# Patient Record
Sex: Female | Born: 1965
Health system: Southern US, Community
[De-identification: ages and names within clinical notes are randomized; demographics above are authoritative.]

## PROBLEM LIST (undated history)

## (undated) DIAGNOSIS — J45909 Unspecified asthma, uncomplicated: Secondary | ICD-10-CM

## (undated) DIAGNOSIS — Z803 Family history of malignant neoplasm of breast: Secondary | ICD-10-CM

## (undated) DIAGNOSIS — Z8042 Family history of malignant neoplasm of prostate: Secondary | ICD-10-CM

## (undated) DIAGNOSIS — Z8041 Family history of malignant neoplasm of ovary: Secondary | ICD-10-CM

## (undated) HISTORY — DX: Family history of malignant neoplasm of ovary: Z80.41

## (undated) HISTORY — DX: Unspecified asthma, uncomplicated: J45.909

## (undated) HISTORY — DX: Family history of malignant neoplasm of prostate: Z80.42

## (undated) HISTORY — PX: UPPER GASTROINTESTINAL ENDOSCOPY: SHX188

## (undated) HISTORY — DX: Family history of malignant neoplasm of breast: Z80.3

---

## 1993-03-07 HISTORY — PX: OTHER SURGICAL HISTORY: SHX169

## 2013-03-07 HISTORY — PX: LAPAROSCOPIC GASTRIC SLEEVE RESECTION: SHX5895

## 2017-03-27 DIAGNOSIS — J209 Acute bronchitis, unspecified: Secondary | ICD-10-CM | POA: Diagnosis not present

## 2017-04-01 DIAGNOSIS — J209 Acute bronchitis, unspecified: Secondary | ICD-10-CM | POA: Diagnosis not present

## 2017-07-11 DIAGNOSIS — L219 Seborrheic dermatitis, unspecified: Secondary | ICD-10-CM | POA: Diagnosis not present

## 2017-07-11 DIAGNOSIS — L700 Acne vulgaris: Secondary | ICD-10-CM | POA: Diagnosis not present

## 2017-08-09 DIAGNOSIS — D225 Melanocytic nevi of trunk: Secondary | ICD-10-CM | POA: Diagnosis not present

## 2017-08-09 DIAGNOSIS — B351 Tinea unguium: Secondary | ICD-10-CM | POA: Diagnosis not present

## 2017-08-09 DIAGNOSIS — Z79899 Other long term (current) drug therapy: Secondary | ICD-10-CM | POA: Diagnosis not present

## 2017-08-09 DIAGNOSIS — D2372 Other benign neoplasm of skin of left lower limb, including hip: Secondary | ICD-10-CM | POA: Diagnosis not present

## 2017-08-09 DIAGNOSIS — B353 Tinea pedis: Secondary | ICD-10-CM | POA: Diagnosis not present

## 2017-08-09 DIAGNOSIS — D485 Neoplasm of uncertain behavior of skin: Secondary | ICD-10-CM | POA: Diagnosis not present

## 2017-08-09 DIAGNOSIS — L821 Other seborrheic keratosis: Secondary | ICD-10-CM | POA: Diagnosis not present

## 2017-08-09 DIAGNOSIS — D2371 Other benign neoplasm of skin of right lower limb, including hip: Secondary | ICD-10-CM | POA: Diagnosis not present

## 2017-09-11 DIAGNOSIS — Z79899 Other long term (current) drug therapy: Secondary | ICD-10-CM | POA: Diagnosis not present

## 2017-09-11 DIAGNOSIS — B351 Tinea unguium: Secondary | ICD-10-CM | POA: Diagnosis not present

## 2018-02-09 DIAGNOSIS — M25562 Pain in left knee: Secondary | ICD-10-CM | POA: Diagnosis not present

## 2018-02-15 DIAGNOSIS — S63621A Sprain of interphalangeal joint of right thumb, initial encounter: Secondary | ICD-10-CM | POA: Diagnosis not present

## 2018-02-15 DIAGNOSIS — S83242A Other tear of medial meniscus, current injury, left knee, initial encounter: Secondary | ICD-10-CM | POA: Diagnosis not present

## 2018-03-13 ENCOUNTER — Other Ambulatory Visit: Payer: Self-pay | Admitting: Orthopedic Surgery

## 2018-03-13 DIAGNOSIS — S83242D Other tear of medial meniscus, current injury, left knee, subsequent encounter: Secondary | ICD-10-CM | POA: Diagnosis not present

## 2018-03-13 DIAGNOSIS — M25562 Pain in left knee: Secondary | ICD-10-CM

## 2018-03-19 ENCOUNTER — Ambulatory Visit
Admission: RE | Admit: 2018-03-19 | Discharge: 2018-03-19 | Disposition: A | Discharge status: Home / Self Care | Payer: Self-pay | Source: Ambulatory Visit | Attending: Orthopedic Surgery | Admitting: Orthopedic Surgery

## 2018-03-19 DIAGNOSIS — M25562 Pain in left knee: Secondary | ICD-10-CM | POA: Diagnosis not present

## 2018-04-26 DIAGNOSIS — S83242D Other tear of medial meniscus, current injury, left knee, subsequent encounter: Secondary | ICD-10-CM | POA: Diagnosis not present

## 2018-04-30 DIAGNOSIS — S83242D Other tear of medial meniscus, current injury, left knee, subsequent encounter: Secondary | ICD-10-CM | POA: Diagnosis not present

## 2018-04-30 DIAGNOSIS — M6281 Muscle weakness (generalized): Secondary | ICD-10-CM | POA: Diagnosis not present

## 2018-04-30 DIAGNOSIS — M25562 Pain in left knee: Secondary | ICD-10-CM | POA: Diagnosis not present

## 2018-05-02 DIAGNOSIS — M6281 Muscle weakness (generalized): Secondary | ICD-10-CM | POA: Diagnosis not present

## 2018-05-02 DIAGNOSIS — S83242D Other tear of medial meniscus, current injury, left knee, subsequent encounter: Secondary | ICD-10-CM | POA: Diagnosis not present

## 2018-05-02 DIAGNOSIS — M25562 Pain in left knee: Secondary | ICD-10-CM | POA: Diagnosis not present

## 2018-05-14 DIAGNOSIS — M6281 Muscle weakness (generalized): Secondary | ICD-10-CM | POA: Diagnosis not present

## 2018-05-14 DIAGNOSIS — S83242D Other tear of medial meniscus, current injury, left knee, subsequent encounter: Secondary | ICD-10-CM | POA: Diagnosis not present

## 2018-05-14 DIAGNOSIS — M25562 Pain in left knee: Secondary | ICD-10-CM | POA: Diagnosis not present

## 2018-05-21 ENCOUNTER — Ambulatory Visit (INDEPENDENT_AMBULATORY_CARE_PROVIDER_SITE_OTHER): Payer: 59 | Admitting: Family Medicine

## 2018-05-21 ENCOUNTER — Other Ambulatory Visit: Payer: Self-pay

## 2018-05-21 ENCOUNTER — Other Ambulatory Visit: Payer: Self-pay | Admitting: Family Medicine

## 2018-05-21 VITALS — BP 123/82 | HR 100 | Temp 98.2°F | Ht 66.0 in | Wt 228.0 lb

## 2018-05-21 DIAGNOSIS — R600 Localized edema: Secondary | ICD-10-CM

## 2018-05-21 DIAGNOSIS — G8929 Other chronic pain: Secondary | ICD-10-CM

## 2018-05-21 DIAGNOSIS — R635 Abnormal weight gain: Secondary | ICD-10-CM | POA: Diagnosis not present

## 2018-05-21 DIAGNOSIS — M25562 Pain in left knee: Secondary | ICD-10-CM

## 2018-05-21 DIAGNOSIS — R739 Hyperglycemia, unspecified: Secondary | ICD-10-CM

## 2018-05-21 DIAGNOSIS — N926 Irregular menstruation, unspecified: Secondary | ICD-10-CM

## 2018-05-21 DIAGNOSIS — Z803 Family history of malignant neoplasm of breast: Secondary | ICD-10-CM

## 2018-05-21 DIAGNOSIS — Z9884 Bariatric surgery status: Secondary | ICD-10-CM

## 2018-05-21 DIAGNOSIS — Z1211 Encounter for screening for malignant neoplasm of colon: Secondary | ICD-10-CM

## 2018-05-21 DIAGNOSIS — F5101 Primary insomnia: Secondary | ICD-10-CM

## 2018-05-21 DIAGNOSIS — Z13220 Encounter for screening for lipoid disorders: Secondary | ICD-10-CM

## 2018-05-21 DIAGNOSIS — F439 Reaction to severe stress, unspecified: Secondary | ICD-10-CM | POA: Diagnosis not present

## 2018-05-21 DIAGNOSIS — R5383 Other fatigue: Secondary | ICD-10-CM

## 2018-05-21 LAB — COMPREHENSIVE METABOLIC PANEL
ALT: 18 U/L (ref 0–35)
AST: 15 U/L (ref 0–37)
Albumin: 4 g/dL (ref 3.5–5.2)
Alkaline Phosphatase: 68 U/L (ref 39–117)
BUN: 10 mg/dL (ref 6–23)
CO2: 29 mEq/L (ref 19–32)
Calcium: 9.2 mg/dL (ref 8.4–10.5)
Chloride: 105 mEq/L (ref 96–112)
Creatinine, Ser: 0.94 mg/dL (ref 0.40–1.20)
GFR: 62.83 mL/min (ref >60.00)
Glucose, Bld: 88 mg/dL (ref 70–99)
Potassium: 4.7 mEq/L (ref 3.5–5.1)
Sodium: 141 mEq/L (ref 135–145)
Total Bilirubin: 0.4 mg/dL (ref 0.2–1.2)
Total Protein: 6.7 g/dL (ref 6.0–8.3)

## 2018-05-21 LAB — LIPID PANEL
Cholesterol: 230 mg/dL — ABNORMAL HIGH (ref 0–200)
HDL: 102.9 mg/dL (ref >39.00)
LDL Cholesterol: 109 mg/dL — ABNORMAL HIGH (ref 0–99)
NonHDL: 127.23
Total CHOL/HDL Ratio: 2
Triglycerides: 90 mg/dL (ref 0.0–149.0)
VLDL: 18 mg/dL (ref 0.0–40.0)

## 2018-05-21 LAB — CBC WITH DIFFERENTIAL/PLATELET
Basophils Absolute: 0.1 10*3/uL (ref 0.0–0.1)
Basophils Relative: 1.6 % (ref 0.0–3.0)
Eosinophils Absolute: 0.3 10*3/uL (ref 0.0–0.7)
Eosinophils Relative: 5.5 % — ABNORMAL HIGH (ref 0.0–5.0)
HCT: 42.6 % (ref 36.0–46.0)
Hemoglobin: 14.2 g/dL (ref 12.0–15.0)
Lymphocytes Relative: 29.6 % (ref 12.0–46.0)
Lymphs Abs: 1.7 10*3/uL (ref 0.7–4.0)
MCHC: 33.5 g/dL (ref 30.0–36.0)
MCV: 91.3 fl (ref 78.0–100.0)
Monocytes Absolute: 0.4 10*3/uL (ref 0.1–1.0)
Monocytes Relative: 6.7 % (ref 3.0–12.0)
Neutro Abs: 3.2 10*3/uL (ref 1.4–7.7)
Neutrophils Relative %: 56.6 % (ref 43.0–77.0)
Platelets: 227 10*3/uL (ref 150.0–400.0)
RBC: 4.66 Mil/uL (ref 3.87–5.11)
RDW: 13.7 % (ref 11.5–15.5)
WBC: 5.6 10*3/uL (ref 4.0–10.5)

## 2018-05-21 LAB — TSH: TSH: 1.33 u[IU]/mL (ref 0.35–4.50)

## 2018-05-21 LAB — LUTEINIZING HORMONE: LH: 8.86 m[IU]/mL

## 2018-05-21 LAB — FOLLICLE STIMULATING HORMONE: FSH: 18 m[IU]/mL

## 2018-05-21 LAB — HEMOGLOBIN A1C: Hgb A1c MFr Bld: 5.5 % (ref 4.6–6.5)

## 2018-05-21 MED ORDER — TRAZODONE HCL 50 MG PO TABS: 25.0000 mg | ORAL_TABLET | Freq: Every evening | ORAL | 3 refills | Status: DC | PRN

## 2018-05-21 MED ORDER — PHENTERMINE HCL 37.5 MG PO TABS: 37.5000 mg | ORAL_TABLET | Freq: Every day | ORAL | 2 refills | Status: DC

## 2018-05-21 NOTE — Telephone Encounter (Signed)
Patient calling to check the status of getting these medications sent to the pharmacy today. States they were supposed to be sent today while at her appointment with Dr Juleen China. Please advise.

## 2018-05-21 NOTE — Patient Instructions (Signed)
You have an appointment scheduled for: []   2D Mammogram  [x]   3D Mammogram  []   Bone Density      Your appointment will at the following location  [x]   [facility]      [place], [place]           Make sure to wear two peace clothing  No lotions powders or deodorants the day of the appointment Make sure to bring picture ID and insurance card.  Bring list of medications you are currently taking including any supplements.

## 2018-05-21 NOTE — Progress Notes (Signed)
Nancy Wyatt is a 53 y.o. female is here to [place].   Patient Care Team: Briscoe Deutscher, DO as PCP - General (Family Medicine)   History of Present Illness:   [name], CMA acting as scribe for Dr. Briscoe Deutscher.   HPI:  Genetic Testing: Patient is concerned mother is a breast cancer survivor that was diagnosed premenopausal. She would like to have order placed for genetic testing as well as lab work done regarding her hormone levels. She request cologuard testing as well as mammogram today. Explained that if she has any positive results wit cologuard that colonoscopy will be needed and will most likey not be paid for by insurance. Patient was fine with that and agreed to have Cologuard ordered.   Depression: Patient has hard time with move here from [place]. She has increased weight gain and depression symptoms. She would like to talk about adding an antidepressant that will not increase weight.   Asthma: Patient has self reported history of asthma. She has been on preventative inhaler in the past but is not sure of the name. She has recently been using her rescue inhaler and would like to start back on a preventative today.   Weight gain: Gastric sleeve in 2015. High weight 280. Lowest weight 170. Gained this year, emotional eating. Recently restarted MVM. Just ordered a Peloton. Working remotely with previous Clinical research associate. Left meniscus injury, followed by Dr. Noemi Chapel. Next appointment is April 6. Previous medication for weight loss - [place].  Health Maintenance Due  Topic Date Due  . HIV Screening  08/08/1982  . TETANUS/TDAP  08/08/1986  . PAP SMEAR-Modifier  08/07/1988  . MAMMOGRAM  08/07/2017  . COLONOSCOPY  08/07/2017  . INFLUENZA VACCINE  10/05/2017   No flowsheet data found.  PMHx, SurgHx, SocialHx, Medications, and Allergies were reviewed in the Visit Navigator and updated as appropriate.   History reviewed. No pertinent past medical history.  History  reviewed. No pertinent surgical history.  History reviewed. No pertinent family history.  Social History   Tobacco Use  . Smoking status: Not on file  Substance Use Topics  . Alcohol use: Not on file  . Drug use: Not on file    Current Medications and Allergies   Current Outpatient Medications:  .  albuterol (PROAIR HFA) 108 (90 Base) MCG/ACT inhaler, ProAir HFA 90 mcg/actuation aerosol inhaler, Disp: 2 Inhaler, Rfl: 3 .  hydrochlorothiazide (HYDRODIURIL) 12.5 MG tablet, hydrochlorothiazide 12.5 mg tablet, Disp: , Rfl:  .  ketoconazole (NIZORAL) 2 % cream, ketoconazole 2 % topical cream, Disp: 15 g, Rfl: 1 .  ketoconazole (NIZORAL) 2 % shampoo, ketoconazole 2 % shampoo, Disp: 120 mL, Rfl: 1 .  phentermine (ADIPEX-P) 37.5 MG tablet, Take 1 tablet (37.5 mg total) by mouth daily before breakfast., Disp: 30 tablet, Rfl: 2 .  terbinafine (LAMISIL) 250 MG tablet, Take 1 tablet (250 mg total) by mouth daily., Disp: 30 tablet, Rfl: 1 .  traZODone (DESYREL) 50 MG tablet, Take 0.5-1 tablets (25-50 mg total) by mouth at bedtime as needed for sleep., Disp: 30 tablet, Rfl: 3   Allergies  Allergen Reactions  . Sulfamethoxazole Rash    hives   Review of Systems   Pertinent items are noted in the HPI. Otherwise, a complete ROS is negative.  Vitals   Vitals:   05/21/18 1013  BP: 123/82  Pulse: 100  Temp: 98.2 F (36.8 C)  TempSrc: Oral  SpO2: 98%  Weight: 228 lb (103.4 kg)  Height: 5\' 6"  (  1.676 m)     Body mass index is 36.8 kg/m.  Physical Exam   Physical Exam Vitals signs and nursing note reviewed.  HENT:     Head: Normocephalic and atraumatic.  Eyes:     Pupils: Pupils are equal, round, and reactive to light.  Neck:     Musculoskeletal: Normal range of motion and neck supple.  Cardiovascular:     Rate and Rhythm: Normal rate and regular rhythm.     Heart sounds: Normal heart sounds.  Pulmonary:     Effort: Pulmonary effort is normal.  Abdominal:     Palpations:  Abdomen is soft.  Skin:    General: Skin is warm.  Psychiatric:        Behavior: Behavior normal.     Results for orders placed or performed in visit on 05/21/18  CBC with Differential/Platelet  Result Value Ref Range   WBC 5.6 4.0 - 10.5 K/uL   RBC 4.66 3.87 - 5.11 Mil/uL   Hemoglobin 14.2 12.0 - 15.0 g/dL   HCT 42.6 36.0 - 46.0 %   MCV 91.3 78.0 - 100.0 fl   MCHC 33.5 30.0 - 36.0 g/dL   RDW 13.7 11.5 - 15.5 %   Platelets 227.0 150.0 - 400.0 K/uL   Neutrophils Relative % 56.6 43.0 - 77.0 %   Lymphocytes Relative 29.6 12.0 - 46.0 %   Monocytes Relative 6.7 3.0 - 12.0 %   Eosinophils Relative 5.5 (H) 0.0 - 5.0 %   Basophils Relative 1.6 0.0 - 3.0 %   Neutro Abs 3.2 1.4 - 7.7 K/uL   Lymphs Abs 1.7 0.7 - 4.0 K/uL   Monocytes Absolute 0.4 0.1 - 1.0 K/uL   Eosinophils Absolute 0.3 0.0 - 0.7 K/uL   Basophils Absolute 0.1 0.0 - 0.1 K/uL  Comprehensive metabolic panel  Result Value Ref Range   Sodium 141 135 - 145 mEq/L   Potassium 4.7 3.5 - 5.1 mEq/L   Chloride 105 96 - 112 mEq/L   CO2 29 19 - 32 mEq/L   Glucose, Bld 88 70 - 99 mg/dL   BUN 10 6 - 23 mg/dL   Creatinine, Ser 0.94 0.40 - 1.20 mg/dL   Total Bilirubin 0.4 0.2 - 1.2 mg/dL   Alkaline Phosphatase 68 39 - 117 U/L   AST 15 0 - 37 U/L   ALT 18 0 - 35 U/L   Total Protein 6.7 6.0 - 8.3 g/dL   Albumin 4.0 3.5 - 5.2 g/dL   Calcium 9.2 8.4 - 10.5 mg/dL   GFR 62.83 >60.00 mL/min  Hemoglobin A1c  Result Value Ref Range   Hgb A1c MFr Bld 5.5 4.6 - 6.5 %  Lipid panel  Result Value Ref Range   Cholesterol 230 (H) 0 - 200 mg/dL   Triglycerides 90.0 0.0 - 149.0 mg/dL   HDL 102.90 >39.00 mg/dL   VLDL 18.0 0.0 - 40.0 mg/dL   LDL Cholesterol 109 (H) 0 - 99 mg/dL   Total CHOL/HDL Ratio 2    NonHDL 127.23   TSH  Result Value Ref Range   TSH 1.33 0.35 - 4.50 uIU/mL  FSH  Result Value Ref Range   FSH 18.0 mIU/ML  LH  Result Value Ref Range   LH 8.86 mIU/mL    Assessment and Plan   Nancy Wyatt was seen today for establish  care.  Diagnoses and all orders for this visit:  Fatigue, unspecified type  History of bariatric surgery, gastric sleeve, 2015  Weight gain -  CBC with Differential/Platelet -     Comprehensive metabolic panel -     Hemoglobin A1c -     Lipid panel -     TSH -     phentermine (ADIPEX-P) 37.5 MG tablet; Take 1 tablet (37.5 mg total) by mouth daily before breakfast.  Situational stress  Family history of breast cancer in first degree relative -     Ambulatory referral to [facility]; Future  Menstrual irregularity -     FSH -     LH  Chronic pain of left knee, menisus injury, followed by [facility]  Lower extremity edema  Screening for malignant neoplasm of colon -     Cologuard  Primary insomnia -     traZODone (DESYREL) 50 MG tablet; Take 0.5-1 tablets (25-50 mg total) by mouth at bedtime as needed for sleep.  Screening for lipid disorders -     Lipid panel  Hyperglycemia -     Hemoglobin A1c    . Orders and follow up as documented in [place], reviewed diet, exercise and weight control, cardiovascular risk and specific lipid/LDL goals reviewed, reviewed medications and side effects in detail.  . Reviewed expectations re: course of current medical issues. . Outlined signs and symptoms indicating need for more acute intervention. . Patient verbalized understanding and all questions were answered. . Patient received an After Visit Summary.  CMA served as Education administrator during this visit. History, Physical, and Plan performed by medical provider. The above documentation has been reviewed and is accurate and complete. Briscoe Deutscher, D.O.  Briscoe Deutscher, DO [place], [place] 05/24/2018  Records requested if needed. Time spent with the patient: 45 minutes, of which >50% was spent in obtaining information about her symptoms, reviewing her previous labs, evaluations, and treatments, counseling her about her condition (please see the  discussed topics above), and developing a plan to further investigate it; she had a number of questions which I addressed.

## 2018-05-21 NOTE — Telephone Encounter (Signed)
Copied from [place] [phone]. Topic: Quick Communication - Rx Refill/Question >> May 21, 2018 12:29 PM [place], [place] D wrote: Medication: albuterol (PROAIR HFA) 108 (90 Base) MCG/ACT inhaler / ketoconazole (NIZORAL) 2 % cream / ketoconazole (NIZORAL) 2 % shampoo / terbinafine (LAMISIL) 250 MG tablet / Pt stated these rx's were not sent to pharmacy during her OV this morning. Please advise.  Has the patient contacted their pharmacy? Yes.   (Agent: If no, request that the patient contact the pharmacy for the refill.) (Agent: If yes, when and what did the pharmacy advise?)  Preferred Pharmacy (with phone number or street name): [id number] - [place], [place]. AT [address] [phone] (Phone) [phone] (Fax)  Agent: Please be advised that RX refills may take up to 3 business days. We ask that you follow-up with your pharmacy.

## 2018-05-22 ENCOUNTER — Encounter: Payer: Self-pay | Admitting: Family Medicine

## 2018-05-22 DIAGNOSIS — G8929 Other chronic pain: Secondary | ICD-10-CM | POA: Insufficient documentation

## 2018-05-22 DIAGNOSIS — F439 Reaction to severe stress, unspecified: Secondary | ICD-10-CM | POA: Insufficient documentation

## 2018-05-22 DIAGNOSIS — M25562 Pain in left knee: Secondary | ICD-10-CM

## 2018-05-22 DIAGNOSIS — R635 Abnormal weight gain: Secondary | ICD-10-CM | POA: Insufficient documentation

## 2018-05-22 DIAGNOSIS — Z9884 Bariatric surgery status: Secondary | ICD-10-CM | POA: Insufficient documentation

## 2018-05-22 DIAGNOSIS — Z803 Family history of malignant neoplasm of breast: Secondary | ICD-10-CM | POA: Insufficient documentation

## 2018-05-22 DIAGNOSIS — N926 Irregular menstruation, unspecified: Secondary | ICD-10-CM | POA: Insufficient documentation

## 2018-05-22 MED ORDER — KETOCONAZOLE 2 % EX SHAM: MEDICATED_SHAMPOO | CUTANEOUS | 1 refills | Status: DC

## 2018-05-22 MED ORDER — KETOCONAZOLE 2 % EX CREA: TOPICAL_CREAM | CUTANEOUS | 1 refills | Status: DC

## 2018-05-22 MED ORDER — ALBUTEROL SULFATE HFA 108 (90 BASE) MCG/ACT IN AERS: INHALATION_SPRAY | RESPIRATORY_TRACT | 3 refills | Status: DC

## 2018-05-22 MED ORDER — TERBINAFINE HCL 250 MG PO TABS: 250.0000 mg | ORAL_TABLET | Freq: Every day | ORAL | 1 refills | Status: DC

## 2018-05-22 NOTE — Telephone Encounter (Signed)
See note

## 2018-05-22 NOTE — Telephone Encounter (Signed)
Requested medication (s) are due for refill today: ? Albuterol, Requested medication (s) are on the active medication list: yes  Last refill:  ?  Future visit scheduled: no  Notes to clinic: historical provider  Requested medication (s) are due for refill today:  ketoconazole cream ?   Requested medication (s) are on the active medication list: yes  Last refill:  ?  Future visit scheduled: no  Notes to clinic: historical provider  Requested medication (s) are due for refill today:  ketoconazole shampoo ?   Requested medication (s) are on the active medication list: yes  Last refill:  ?  Future visit scheduled: no  Notes to clinic: historical provider  Requested medication (s) are due for refill today: terbinafine ?  Requested medication (s) are on the active medication list: yes  Last refill:  ?  Future visit scheduled: no  Notes to clinic: historical provider  Requested Prescriptions  Pending Prescriptions Disp Refills   albuterol (PROAIR HFA) 108 (90 Base) MCG/ACT inhaler       Pulmonology:  Beta Agonists Failed - 05/22/2018  7:45 AM      Failed - One inhaler should last at least one month. If the patient is requesting refills earlier, contact the patient to check for uncontrolled symptoms.      Passed - Valid encounter within last 12 months    Recent Outpatient Visits          Yesterday Fatigue, unspecified type   [place] Wallace, [place], DO      Future Appointments            In 3 months Juleen China, [place], DO [facility]          ketoconazole (NIZORAL) 2 % cream 15 g      Over the Counter:  OTC Passed - 05/22/2018  7:45 AM      Passed - Valid encounter within last 12 months    Recent Outpatient Visits          Yesterday Fatigue, unspecified type   [place] Wallace, [place], DO      Future Appointments            In 3 months Briscoe Deutscher, DO [place]  [place], [facility]          ketoconazole (NIZORAL) 2 % shampoo 120 mL      Over the Counter:  OTC Passed - 05/22/2018  7:45 AM      Passed - Valid encounter within last 12 months    Recent Outpatient Visits          Yesterday Fatigue, unspecified type   [place] Wallace, [place], DO      Future Appointments            In 3 months Briscoe Deutscher, [place], [facility]          terbinafine (LAMISIL) 250 MG tablet       Off-Protocol Failed - 05/22/2018  7:45 AM      Failed - Medication not assigned to a protocol, review manually.      Passed - Valid encounter within last 12 months    Recent Outpatient Visits          Yesterday Fatigue, unspecified type   [place] Wallace, [place], DO      Future Appointments            In 3 months Juleen China,  Danae Chen, [place], [place]

## 2018-05-24 ENCOUNTER — Encounter: Payer: Self-pay | Admitting: Family Medicine

## 2018-05-28 ENCOUNTER — Other Ambulatory Visit: Payer: Self-pay

## 2018-05-28 MED ORDER — KETOCONAZOLE 2 % EX SHAM: MEDICATED_SHAMPOO | CUTANEOUS | 1 refills | Status: DC

## 2018-05-31 ENCOUNTER — Telehealth: Payer: Self-pay | Admitting: Genetic Counselor

## 2018-05-31 ENCOUNTER — Encounter: Payer: Self-pay | Admitting: Genetic Counselor

## 2018-05-31 NOTE — Telephone Encounter (Signed)
A genetic counseling appt has been scheduled for the pt to see Roma Kayser on 6/10 at 10am. Letter mailed.

## 2018-06-07 ENCOUNTER — Other Ambulatory Visit: Payer: Self-pay

## 2018-06-07 MED ORDER — ALBUTEROL SULFATE HFA 108 (90 BASE) MCG/ACT IN AERS: INHALATION_SPRAY | RESPIRATORY_TRACT | 3 refills | Status: AC

## 2018-06-30 ENCOUNTER — Encounter: Payer: Self-pay | Admitting: Family Medicine

## 2018-07-23 DIAGNOSIS — M94262 Chondromalacia, left knee: Secondary | ICD-10-CM | POA: Diagnosis not present

## 2018-08-01 ENCOUNTER — Other Ambulatory Visit: Payer: Self-pay | Admitting: Family Medicine

## 2018-08-01 NOTE — Telephone Encounter (Signed)
Last OV 05/21/18 Last refill 05/22/18 #30/1 Next OV 08/20/18

## 2018-08-08 ENCOUNTER — Other Ambulatory Visit: Payer: Self-pay | Admitting: Family Medicine

## 2018-08-08 DIAGNOSIS — F5101 Primary insomnia: Secondary | ICD-10-CM

## 2018-08-13 ENCOUNTER — Telehealth: Payer: Self-pay | Admitting: Genetic Counselor

## 2018-08-13 NOTE — Telephone Encounter (Signed)
Called patient regarding upcoming Webex appointment, per patient's request lab appointment has been cancelled and patient will be doing a virtual visit.

## 2018-08-15 ENCOUNTER — Encounter: Payer: Self-pay | Admitting: Genetic Counselor

## 2018-08-15 ENCOUNTER — Other Ambulatory Visit: Payer: 59

## 2018-08-15 ENCOUNTER — Inpatient Hospital Stay: Payer: 59 | Attending: Genetic Counselor | Admitting: Genetic Counselor

## 2018-08-15 DIAGNOSIS — Z8041 Family history of malignant neoplasm of ovary: Secondary | ICD-10-CM | POA: Insufficient documentation

## 2018-08-15 DIAGNOSIS — Z8042 Family history of malignant neoplasm of prostate: Secondary | ICD-10-CM

## 2018-08-15 DIAGNOSIS — Z803 Family history of malignant neoplasm of breast: Secondary | ICD-10-CM

## 2018-08-15 DIAGNOSIS — Z1379 Encounter for other screening for genetic and chromosomal anomalies: Secondary | ICD-10-CM | POA: Diagnosis not present

## 2018-08-15 NOTE — Progress Notes (Signed)
REFERRING PROVIDER: Briscoe Deutscher, [place], [place] [address]  PRIMARY PROVIDER:  Briscoe Deutscher, DO  PRIMARY REASON FOR VISIT:  1. Family history of breast cancer   2. Family history of ovarian cancer   3. Family history of prostate cancer      HISTORY OF PRESENT ILLNESS:   I connected with Nancy Wyatt on 08/15/2018 at 10 AM EDT by Webex video conference and verified that I am speaking with the correct person using two identifiers.   Patient location: Home Provider location: Office  Nancy Wyatt, a 53 y.o. female, was seen for a [facility] cancer genetics consultation at the request of Dr. Juleen China due to a family history of breast cancer.  Nancy Wyatt presents to clinic today to discuss the possibility of a hereditary predisposition to cancer, genetic testing, and to further clarify her future cancer risks, as well as potential cancer risks for family members.   Nancy Wyatt is a 53 y.o. female with no personal history of cancer.    CANCER HISTORY:   No history exists.     RISK FACTORS:  Menarche was at age 53.  First live birth at age N/A.  OCP use for approximately 0 years.  Ovaries intact: yes.  Hysterectomy: no.  Menopausal status: premenopausal.  HRT use: 0 years. Colonoscopy: yes; normal. Mammogram within the last year: no. Number of breast biopsies: 0. Up to date with pelvic exams: yes. Any excessive radiation exposure in the past: no  Past Medical History:  Diagnosis Date  . Family history of breast cancer   . Family history of ovarian cancer   . Family history of prostate cancer     No past surgical history on file.  Social History   Socioeconomic History  . Marital status: Single    Spouse name: Not on file  . Number of children: Not on file  . Years of education: Not on file  . Highest education level: Not on file  Occupational History  . Not on file  Social Needs  . Financial resource strain: Not on file  . Food  insecurity:    Worry: Not on file    Inability: Not on file  . Transportation needs:    Medical: Not on file    Non-medical: Not on file  Tobacco Use  . Smoking status: Never Smoker  . Smokeless tobacco: Never Used  Substance and Sexual Activity  . Alcohol use: Yes  . Drug use: Never  . Sexual activity: Yes    Partners: Male  Lifestyle  . Physical activity:    Days per week: Not on file    Minutes per session: Not on file  . Stress: Not on file  Relationships  . Social connections:    Talks on phone: Not on file    Gets together: Not on file    Attends religious service: Not on file    Active member of club or organization: Not on file    Attends meetings of clubs or organizations: Not on file    Relationship status: Not on file  Other Topics Concern  . Not on file  Social History Narrative  . Not on file     FAMILY HISTORY:  We obtained a detailed, 4-generation family history.  Significant diagnoses are listed below: Family History  Problem Relation Age of Onset  . Breast cancer Mother 24  . Asthma Father   . Heart attack Father   . Heart disease Father   .  Prostate cancer Maternal Grandfather   . Mental illness Brother   . Ovarian cancer Maternal Aunt 70  . Head & neck cancer Maternal Uncle   . Skin cancer Maternal Aunt   . Breast cancer Cousin 21  . Skin cancer Cousin   . Skin cancer Cousin   . Breast cancer Paternal Aunt 64  . Breast cancer Paternal Aunt 6    The patient does not have children.  She has three brothers who are cancer free.  Her father is deceased and her mother is living.  The patient's father died of a heart attack.  He had tow brothers and three sisters.  Two sisters had breast cancer.  Both of his parents are deceased.  The patient's father died of a heart attack and his mother died of a non-malignant brain tumor.  His mother had two nieces who had brain tumors as well.  The patient's mother had breast cancer at 74.  She had three  brothers and seven sisters.  One brother had a head/neck cancer, one sister had ovarian cancer, and one sister had skin cancer.  This last sister has a daughter who had breast cancer at 18.  The patient's maternal grandparents are deceased.  The grandfather had prostate cancer.  Nancy Wyatt is unaware of previous family history of genetic testing for hereditary cancer risks. Patient's maternal ancestors are of [place] descent, and paternal ancestors are of [place] descent. There is no reported Ashkenazi Jewish ancestry. There is no known consanguinity.    GENETIC COUNSELING ASSESSMENT: Nancy Wyatt is a 53 y.o. female with a family history of cancer which is somewhat suggestive of a hereditary cancer syndrome and predisposition to cancer. We, therefore, discussed and recommended the following at today's visit.   DISCUSSION: We discussed that 5 - 10% of breast cancer is hereditary, with most cases associated with BRCA mutations.  There are other genes that can be associated with hereditary breast cancer syndromes.  We discussed that the patient has bilineal risk, with breast cancer on both sides of the family.  Her father's family also has a family history of brain cancer.  We discussed that this bilineal risk can increase her risk for breast cancer, even if there is not a hereditary mutation in the family.  We discussed that testing is beneficial for several reasons including knowing how to follow individuals after completing their treatment, identifying whether potential treatment options such as PARP inhibitors would be beneficial, and understand if other family members could be at risk for cancer and allow them to undergo genetic testing.   We reviewed the characteristics, features and inheritance patterns of hereditary cancer syndromes. We also discussed genetic testing, including the appropriate family members to test, the process of testing, insurance coverage and turn-around-time for results. We  discussed the implications of a negative, positive and/or variant of uncertain significant result. We recommended Nancy Wyatt pursue genetic testing for the multi-cancer gene panel. The Multi-Gene Panel offered by [facility] includes sequencing and/or deletion duplication testing of the following 85 genes: AIP, ALK, APC, ATM, AXIN2,BAP1,  BARD1, BLM, BMPR1A, BRCA1, BRCA2, BRIP1, CASR, CDC73, CDH1, CDK4, CDKN1B, CDKN1C, CDKN2A (p14ARF), CDKN2A (p16INK4a), CEBPA, CHEK2, CTNNA1, DICER1, DIS3L2, EGFR (c.2369C>T, p.Thr790Met variant only), EPCAM (Deletion/duplication testing only), FH, FLCN, GATA2, GPC3, GREM1 (Promoter region deletion/duplication testing only), HOXB13 (c.251G>A, p.Gly84Glu), HRAS, KIT, MAX, MEN1, MET, MITF (c.952G>A, p.Glu318Lys variant only), MLH1, MSH2, MSH3, MSH6, MUTYH, NBN, NF1, NF2, NTHL1, PALB2, PDGFRA, PHOX2B, PMS2, POLD1, POLE, POT1, PRKAR1A, PTCH1, PTEN, RAD50,  RAD51C, RAD51D, RB1, RECQL4, RET, RNF43, RUNX1, SDHAF2, SDHA (sequence changes only), SDHB, SDHC, SDHD, SMAD4, SMARCA4, SMARCB1, SMARCE1, STK11, SUFU, TERC, TERT, TMEM127, TP53, TSC1, TSC2, VHL, WRN and WT1.    Based on Nancy Wyatt's family history of cancer, she meets medical criteria for genetic testing. Despite that she meets criteria, she may still have an out of pocket cost. We discussed that if her out of pocket cost for testing is over $100, the laboratory will call and confirm whether she wants to proceed with testing.  If the out of pocket cost of testing is less than $100 she will be billed by the genetic testing laboratory.   Based on the patient's family history, a statistical model (Tyrer Cusik) was used to estimate her risk of developing breast cancer. This estimates her lifetime risk of developing breast cancer to be approximately 41.3%. This estimation does not consider any genetic testing results.  The patient's lifetime breast cancer risk is a preliminary estimate based on available information using one of several  models endorsed by the [place] (ACS). The ACS recommends consideration of breast MRI screening as an adjunct to mammography for patients at high risk (defined as 20% or greater lifetime risk).   Nancy Wyatt has been determined to be at high risk for breast cancer.  Therefore, we recommend that annual screening with mammography and breast MRI be performed.  We discussed that Nancy Wyatt should discuss her individual situation with her referring physician and determine a breast cancer screening plan with which they are both comfortable.      PLAN: After considering the risks, benefits, and limitations, Nancy Wyatt provided informed consent to pursue genetic testing and the blood sample was sent to [facility] for analysis of the multi-cancer panel. Results should be available within approximately 2-3 weeks' time, at which point they will be disclosed by telephone to Nancy Wyatt, as will any additional recommendations warranted by these results. Nancy Wyatt will receive a summary of her genetic counseling visit and a copy of her results once available. This information will also be available in Epic.   Lastly, we encouraged Nancy Wyatt to remain in contact with cancer genetics annually so that we can continuously update the family history and inform her of any changes in cancer genetics and testing that may be of benefit for this family.   Nancy Wyatt questions were answered to her satisfaction today. Our contact information was provided should additional questions or concerns arise. Thank you for the referral and allowing [place] to share in the care of your patient.    P. Florene Glen, [place], [facility] Certified Genetic Counselor [place] [email or web address] phone: [phone]  The patient was seen for a total of 65 minutes in face-to-face genetic counseling.  This patient was discussed with Drs. Magrinat, Lindi Adie and/or Burr Medico who agrees with the above.     _______________________________________________________________________ For Office Staff:  Number of people involved in session: 1 Was an Intern/ student involved with case: no

## 2018-08-17 ENCOUNTER — Other Ambulatory Visit: Payer: Self-pay

## 2018-08-19 NOTE — Progress Notes (Signed)
Virtual Visit via Video   Due to the COVID-19 pandemic, this visit was completed with telemedicine (audio/video) technology to reduce patient and provider exposure as well as to preserve personal protective equipment.   I connected with Nancy Wyatt by a video enabled telemedicine application and verified that I am speaking with the correct person using two identifiers. Location patient: Home Location provider: [facility], Office Persons participating in the virtual visit: Nancy Wyatt, Nancy Wyatt, Nancy Wyatt, CMA acting as scribe for Dr. Briscoe Wyatt.   I discussed the limitations of evaluation and management by telemedicine and the availability of in person appointments. The patient expressed understanding and agreed to proceed.  Care Team   Patient Care Team: Nancy Deutscher, Nancy as PCP - General (Family Medicine) Elsie Saas, MD as Consulting Physician (Orthopedic Surgery)  Subjective:   HPI: Patient had been started on Phentermine as well as Trazodone. She only took for about two weeks. She has had a lot of issues with Covid deaths (her 99, a very good friend's son that was only 75 and very good friends mother and father)  that were close to her as well as issues with not eating well or exsecting sue to issues. She would like to restart.   She has had increased back pain and thinks that it may be due to the increase in size of breast. Hopes that this may improve with weight loss, but even with previous weight loss had discomfort from pendulous breasts.   FamHx update, grandparents with colon issues. Now, okay to get colonoscopy.   Genetic counselor visit reviewed. Needs breast MRI.  In [place] with her mom until September. Working remotely due to [employer].   Interested in therapy. T[employer]oc is through her benefits. Will offer other options in [place].  Tumeric recommended by Orthopedist.   Seborrheic dermatitis/psoriasis flare on face. Has requested Protopic  through Dermatology.   Due for CPE, PAP.   Review of Systems  Constitutional: Negative for chills and fever.  HENT: Negative for hearing loss and tinnitus.   Eyes: Negative for blurred vision and double vision.  Respiratory: Negative for cough.   Cardiovascular: Negative for chest pain, palpitations and leg swelling.  Gastrointestinal: Negative for heartburn, nausea and vomiting.  Genitourinary: Negative for dysuria, frequency and urgency.  Musculoskeletal: Negative for myalgias and neck pain.  Neurological: Negative for dizziness and headaches.  Psychiatric/Behavioral: Negative for depression and suicidal ideas.     Patient Active Problem List   Diagnosis Date Noted  . Pendulous breast 08/20/2018  . Family history of breast cancer   . Family history of ovarian cancer   . Family history of prostate cancer   . Chronic pain of left knee, menisus injury, followed by [facility] 05/22/2018  . Menstrual irregularity 05/22/2018  . Family history of breast cancer in first degree relative 05/22/2018  . Situational stress 05/22/2018  . Weight gain 05/22/2018  . History of bariatric surgery, gastric sleeve, 2015 05/22/2018    Social History   Tobacco Use  . Smoking status: Never Smoker  . Smokeless tobacco: Never Used  Substance Use Topics  . Alcohol use: Yes    Current Outpatient Medications:  .  albuterol (PROAIR HFA) 108 (90 Base) MCG/ACT inhaler, 2 puff ever 8 hours as needed., Disp: 2 Inhaler, Rfl: 3 .  hydrochlorothiazide (HYDRODIURIL) 12.5 MG tablet, hydrochlorothiazide 12.5 mg tablet, Disp: , Rfl:  .  ketoconazole (NIZORAL) 2 % cream, ketoconazole 2 % topical cream, Disp: 15 g, Rfl: 1 .  ketoconazole (NIZORAL) 2 % shampoo, USE AS DIRECTED, Disp: 120 mL, Rfl: 1 .  phentermine (ADIPEX-P) 37.5 MG tablet, Take 1 tablet (37.5 mg total) by mouth daily before breakfast., Disp: 30 tablet, Rfl: 2 .  traZODone (DESYREL) 50 MG tablet, TAKE 1/2 TO 1 TABLET BY MOUTH EVERY DAY AT  BEDTIME AS NEEDED FOR SLEEP., Disp: 90 tablet, Rfl: 2  Allergies  Allergen Reactions  . Sulfamethoxazole Rash    hives    Objective:   VITALS: Per patient if applicable, see vitals. GENERAL: Alert, appears well and in no acute distress. HEENT: Atraumatic, conjunctiva clear, no obvious abnormalities on inspection of external nose and ears. NECK: Normal movements of the head and neck. CARDIOPULMONARY: No increased WOB. Speaking in clear sentences. I:E ratio WNL.  MS: Moves all visible extremities without noticeable abnormality. PSYCH: Pleasant and cooperative, well-groomed. Speech normal rate and rhythm. Affect is appropriate. Insight and judgement are appropriate. Attention is focused, linear, and appropriate.  NEURO: CN grossly intact. Oriented as arrived to appointment on time with no prompting. Moves both UE equally.  SKIN: No obvious lesions, wounds, erythema, or cyanosis noted on face or hands.  Depression screen PHQ 2/9 06/30/2018  Decreased Interest 1  Down, Depressed, Hopeless 2  PHQ - 2 Score 3  Altered sleeping 2  Tired, decreased energy 2  Change in appetite 3  Feeling bad or failure about yourself  3  Trouble concentrating 2  Moving slowly or fidgety/restless 1  Suicidal thoughts 0  PHQ-9 Score 16  Difficult doing work/chores Somewhat difficult   Assessment and Plan:   Athenia was seen today for follow-up.  Diagnoses and all orders for this visit:  Situational stress  Encounter for screening colonoscopy -     Ambulatory referral to Gastroenterology  History of bariatric surgery, gastric sleeve, 2015  Chronic pain of left knee, menisus injury, followed by [facility]  Pendulous breast  Weight gain Patient will start the Phentermine 37.5 daily. She will decrease CHO and increase exercise as tolerated.  Primary insomnia Patient will restart Trazodone at 25 mg q hs.  Morbid obesity ([place])  Seborrheic dermatitis   . COVID-19 Education: The signs and  symptoms of COVID-19 were discussed with the patient and how to seek care for testing if needed. The importance of social distancing was discussed today. . Reviewed expectations re: course of current medical issues. . Discussed self-management of symptoms. . Outlined signs and symptoms indicating need for more acute intervention. . Patient verbalized understanding and all questions were answered. Marland Kitchen Health Maintenance issues including appropriate healthy diet, exercise, and smoking avoidance were discussed with patient. . See orders for this visit as documented in the electronic medical record.  Nancy Deutscher, Nancy  Records requested if needed. Time spent: 25 minutes, of which >50% was spent in obtaining information about her symptoms, reviewing her previous labs, evaluations, and treatments, counseling her about her condition (please see the discussed topics above), and developing a plan to further investigate it; she had a number of questions which I addressed.

## 2018-08-20 ENCOUNTER — Encounter: Payer: Self-pay | Admitting: Family Medicine

## 2018-08-20 ENCOUNTER — Ambulatory Visit (INDEPENDENT_AMBULATORY_CARE_PROVIDER_SITE_OTHER): Payer: 59 | Admitting: Family Medicine

## 2018-08-20 ENCOUNTER — Other Ambulatory Visit: Payer: Self-pay

## 2018-08-20 VITALS — Ht 66.0 in | Wt 228.0 lb

## 2018-08-20 DIAGNOSIS — F439 Reaction to severe stress, unspecified: Secondary | ICD-10-CM

## 2018-08-20 DIAGNOSIS — F5101 Primary insomnia: Secondary | ICD-10-CM

## 2018-08-20 DIAGNOSIS — L219 Seborrheic dermatitis, unspecified: Secondary | ICD-10-CM

## 2018-08-20 DIAGNOSIS — M25562 Pain in left knee: Secondary | ICD-10-CM | POA: Diagnosis not present

## 2018-08-20 DIAGNOSIS — Z1211 Encounter for screening for malignant neoplasm of colon: Secondary | ICD-10-CM | POA: Diagnosis not present

## 2018-08-20 DIAGNOSIS — G8929 Other chronic pain: Secondary | ICD-10-CM

## 2018-08-20 DIAGNOSIS — R635 Abnormal weight gain: Secondary | ICD-10-CM

## 2018-08-20 DIAGNOSIS — N6489 Other specified disorders of breast: Secondary | ICD-10-CM | POA: Insufficient documentation

## 2018-08-20 DIAGNOSIS — Z9884 Bariatric surgery status: Secondary | ICD-10-CM

## 2018-08-24 ENCOUNTER — Encounter: Payer: Self-pay | Admitting: Family Medicine

## 2018-09-12 ENCOUNTER — Encounter: Payer: Self-pay | Admitting: Family Medicine

## 2018-09-17 ENCOUNTER — Encounter: Payer: Self-pay | Admitting: Family Medicine

## 2018-09-17 NOTE — Telephone Encounter (Signed)
See note  Copied from [place] [id number]. Topic: General - Other >> Sep 17, 2018 12:36 PM Burchel, Abbi R wrote: Reason for CRM: Pt requesting call back re: myChart msg from 09/12/2018.  Please call pt: [phone]

## 2018-09-17 NOTE — Telephone Encounter (Signed)
Please see message and advise.  Thank you. ° °

## 2018-09-19 ENCOUNTER — Telehealth: Payer: Self-pay

## 2018-09-19 NOTE — Telephone Encounter (Signed)
Left message to return call to our office.  

## 2018-09-19 NOTE — Telephone Encounter (Signed)
Patient returned call to Sheria Lang asking for a call back please

## 2018-09-19 NOTE — Telephone Encounter (Signed)
Please advise 

## 2018-09-19 NOTE — Telephone Encounter (Signed)
Copied from [place] [id number]. Topic: General - Other >> Sep 17, 2018 12:36 PM Burchel, Abbi R wrote: Reason for CRM: Pt requesting call back re: myChart msg from 09/12/2018.  Please call pt: [phone] >> Sep 19, 2018 12:34 PM Ivar Drape wrote: Patient is checking on the status of her 09/12/2018 request.

## 2018-09-19 NOTE — Telephone Encounter (Signed)
Per our discussion. If lots more questions, set up a visit.

## 2018-09-20 NOTE — Telephone Encounter (Signed)
Pt is calling back and would like joellen to return her call

## 2018-09-25 ENCOUNTER — Telehealth: Payer: Self-pay | Admitting: Genetic Counselor

## 2018-09-25 NOTE — Telephone Encounter (Signed)
LM on VM that lab had not received saliva kit that had been mailed.  Asked that she please CB to let me know if she would like another kit sent, or if she would like to come in for a blood sample.

## 2018-09-28 NOTE — Telephone Encounter (Signed)
Called patient l/m to call office  

## 2018-10-01 ENCOUNTER — Telehealth: Payer: Self-pay | Admitting: Family Medicine

## 2018-10-01 NOTE — Telephone Encounter (Signed)
Called patient reviewed as many of the questions that I could help with. F/u made with Nancy Wyatt will need 40 min app

## 2018-10-01 NOTE — Telephone Encounter (Signed)
Patient is calling because she not in [place] is wanting to get referral for MRI based on her being bilateral high risk.  Wanting colonoscopy scheduled. Shared with patient [facility] contact.  Information patient has other questions from 09/12/2018 [facility] message.  For Joellen.  CB- [phone]

## 2018-10-07 NOTE — Progress Notes (Signed)
Virtual Visit via Video   Due to the COVID-19 pandemic, this visit was completed with telemedicine (audio/video) technology to reduce patient and provider exposure as well as to preserve personal protective equipment.   I connected with Nancy Wyatt by a video enabled telemedicine application and verified that I am speaking with the correct person using two identifiers. Location patient: Home Location provider: [facility], Office Persons participating in the virtual visit: Nancy Wyatt, Nancy Deutscher, DO Nancy Wyatt, CMA acting as scribe for Dr. Briscoe Wyatt.   I discussed the limitations of evaluation and management by telemedicine and the availability of in person appointments. The patient expressed understanding and agreed to proceed.  Care Team   Patient Care Team: Nancy Deutscher, DO as PCP - General (Family Medicine) Nancy Saas, MD as Consulting Physician (Orthopedic Surgery)  Subjective:   Nancy Wyatt, as CMA scribe.   HPI:  Patient is down 29 lbs from last visit. She has been having issues with finding things that contain protein and fiber without having carbs. Wanted to see if there are any of the vitamins that she can help. She is still having issues with constipation.   From my chart message on 09/17/2018 I restarted the medicine, healthy eating and exercise on 6/16.  I lost about 10 pounds the first week and nothing week 2 and 3.    I have been drinking between 80-100 ounces of water daily Walking between 10-15k steps most days 20 min Peloton beginner ride 3x a week starting week 2 Intermittent fasting with protein, low carb and no gluten, no sugar, no dairy diet with the exception of two glasses of wine 1 night on the weekend  Was originally doing one meal a day and fasting close to 18 hours but after week 2 was no weight loss And not going to the bathroom I switched to 16 hour fasting window and added a green smoothie or salad with protein and  whole 30 Dressing for lunch  I would be done 2 ounces up 3 ounces and still not going to bathroom.. no discomfort no extra gas etc but I thought that might be impacting the scale..     I tried adding extra probiotics, added Triphala at a friends recommendation and no dice.  Even tried castor oil and had to take it 2 night in a row before small effect.    Added popcorn - smartpop over the weekend as that is the only thing I know worked in past for me and now I did go to bathroom but consumed so Many carbs that my weight went up this weekend by 4 pounds?!    As of this morning it went down 1 but essentially in one month with drastic changes I have only lost 8 pounds .Marland Kitchen I am concerned that while 2 pounds a week is "healthy" weight loss something doesn't seem like my body wants to cooperate.  I am tolerating the medicine and otherwise feel good but I wonder if my hormones or thyroid are off. I have been so diligent I wanted to check in because I don't think this is what you would have expected either.  Review of Systems  Constitutional: Negative for chills and fever.  HENT: Negative for hearing loss and tinnitus.   Eyes: Negative for blurred vision and double vision.  Respiratory: Negative for cough and wheezing.   Cardiovascular: Negative for chest pain, palpitations and leg swelling.  Gastrointestinal: Negative for heartburn and nausea.  Genitourinary: Negative for  dysuria and urgency.  Musculoskeletal: Negative for myalgias.  Neurological: Negative for dizziness and headaches.  Psychiatric/Behavioral: Negative for depression and suicidal ideas.     Patient Active Problem List   Diagnosis Date Noted  . Pendulous breast 08/20/2018  . Family history of breast cancer   . Family history of ovarian cancer   . Family history of prostate cancer   . Chronic pain of left knee, menisus injury, followed by [facility] 05/22/2018  . Menstrual irregularity 05/22/2018  . Family history of  breast cancer in first degree relative 05/22/2018  . Situational stress 05/22/2018  . Weight gain 05/22/2018  . History of bariatric surgery, gastric sleeve, 2015 05/22/2018    Social History   Tobacco Use  . Smoking status: Never Smoker  . Smokeless tobacco: Never Used  Substance Use Topics  . Alcohol use: Yes   Current Outpatient Medications:  .  albuterol (PROAIR HFA) 108 (90 Base) MCG/ACT inhaler, 2 puff ever 8 hours as needed., Disp: 2 Inhaler, Rfl: 3 .  hydrochlorothiazide (HYDRODIURIL) 12.5 MG tablet, hydrochlorothiazide 12.5 mg tablet, Disp: , Rfl:  .  ketoconazole (NIZORAL) 2 % cream, ketoconazole 2 % topical cream, Disp: 15 g, Rfl: 1 .  ketoconazole (NIZORAL) 2 % shampoo, USE AS DIRECTED, Disp: 120 mL, Rfl: 1 .  phentermine (ADIPEX-P) 37.5 MG tablet, Take 1 tablet (37.5 mg total) by mouth daily before breakfast., Disp: 30 tablet, Rfl: 2 .  traZODone (DESYREL) 50 MG tablet, TAKE 1/2 TO 1 TABLET BY MOUTH EVERY DAY AT BEDTIME AS NEEDED FOR SLEEP., Disp: 90 tablet, Rfl: 2  Allergies  Allergen Reactions  . Sulfamethoxazole Rash    hives    Objective:   VITALS: Per patient if applicable, see vitals. GENERAL: Alert, appears well and in no acute distress. HEENT: Atraumatic, conjunctiva clear, no obvious abnormalities on inspection of external nose and ears. NECK: Normal movements of the head and neck. CARDIOPULMONARY: No increased WOB. Speaking in clear sentences. I:E ratio WNL.  MS: Moves all visible extremities without noticeable abnormality. PSYCH: Pleasant and cooperative, well-groomed. Speech normal rate and rhythm. Affect is appropriate. Insight and judgement are appropriate. Attention is focused, linear, and appropriate.  NEURO: CN grossly intact. Oriented as arrived to appointment on time with no prompting. Moves both UE equally.  SKIN: No obvious lesions, wounds, erythema, or cyanosis noted on face or hands.  Depression screen PHQ 2/9 06/30/2018  Decreased Interest 1   Down, Depressed, Hopeless 2  PHQ - 2 Score 3  Altered sleeping 2  Tired, decreased energy 2  Change in appetite 3  Feeling bad or failure about yourself  3  Trouble concentrating 2  Moving slowly or fidgety/restless 1  Suicidal thoughts 0  PHQ-9 Score 16  Difficult doing work/chores Somewhat difficult    Assessment and Plan:   Theodosia was seen today for follow-up.  Diagnoses and all orders for this visit:  Irregular menses Comments: PCOS labs today. Orders: -     17-Hydroxyprogesterone; Future -     Androstenedione; Future -     DHEA-sulfate; Future -     FSH/LH; Future -     Prolactin; Future -     Testos,Total,Free and SHBG (Female); Future  Lower extremity edema Comments: Continue hydrochlorothiazide.  It sounds like most of her weight loss happened once the HCTZ was initiated. Orders: -     hydrochlorothiazide (HYDRODIURIL) 12.5 MG tablet; hydrochlorothiazide 12.5 mg tablet  Weight gain Comments: Reviewed diet and exercise Hx at length.  Orders: -  phentermine (ADIPEX-P) 37.5 MG tablet; Take 1 tablet (37.5 mg total) by mouth daily before breakfast. -     TSH; Future -     Thyroid Peroxidase Antibodies (TPO) (REFL); Future  History of bariatric surgery, gastric sleeve, 2015  Obesity (BMI 30.0-34.9) Comments: We had a long discussion regarding her concerns.  We will go ahead and order labs as below.  This will be done in [place], current location.  Will order sleep study to make sure that sleep apnea is not contributing to weight stall.  However, patient has lost 28 pounds from her highest weight since our first visit.  Diet recall shows that she fasts in the morning eats at noon, usually a salad with protein, then eats dinner.  She drinks a green drink with collagen protein at night.  She does work on getting protein in her diet.  Made a recommendation to the patient that she should increase the carbohydrates and calories in her diet.  I doubt that her current  diet is supporting her metabolism.  We discussed the importance of building good habits during this time.  We reviewed the importance of remembering that the scale is just one data point and that her overall health is improving during this time.  She will need to be watched carefully to make sure that she does not develop disordered eating.  She did seem very open to the feedback today and will increase foods and fluid.  Hyperglycemia -     Insulin, Free (Bioactive); Future  Seborrheic dermatitis -     ketoconazole (NIZORAL) 2 % cream; ketoconazole 2 % topical cream  Sleep-disordered breathing Comments: I will direct the patient to an online home sleep study site.  Other constipation Comments: Likely linked to her diet and history of bariatric surgery.  We reviewed the importance of hydration.  Recommended psyllium capsules.  Okay MiraLAX.   Marland Kitchen COVID-19 Education: The signs and symptoms of COVID-19 were discussed with the patient and how to seek care for testing if needed. The importance of social distancing was discussed today. . Reviewed expectations re: course of current medical issues. . Discussed self-management of symptoms. . Outlined signs and symptoms indicating need for more acute intervention. . Patient verbalized understanding and all questions were answered. Marland Kitchen Health Maintenance issues including appropriate healthy diet, exercise, and smoking avoidance were discussed with patient. . See orders for this visit as documented in the electronic medical record.  Nancy Deutscher, DO  Records requested if needed. Time spent: 25 minutes, of which >50% was spent in obtaining information about her symptoms, reviewing her previous labs, evaluations, and treatments, counseling her about her condition (please see the discussed topics above), and developing a plan to further investigate it; she had a number of questions which I addressed.

## 2018-10-08 ENCOUNTER — Encounter: Payer: Self-pay | Admitting: Family Medicine

## 2018-10-08 ENCOUNTER — Ambulatory Visit (INDEPENDENT_AMBULATORY_CARE_PROVIDER_SITE_OTHER): Payer: 59 | Admitting: Family Medicine

## 2018-10-08 VITALS — Ht 66.0 in | Wt 199.9 lb

## 2018-10-08 DIAGNOSIS — K5909 Other constipation: Secondary | ICD-10-CM

## 2018-10-08 DIAGNOSIS — G4730 Sleep apnea, unspecified: Secondary | ICD-10-CM

## 2018-10-08 DIAGNOSIS — Z9884 Bariatric surgery status: Secondary | ICD-10-CM

## 2018-10-08 DIAGNOSIS — L219 Seborrheic dermatitis, unspecified: Secondary | ICD-10-CM

## 2018-10-08 DIAGNOSIS — R635 Abnormal weight gain: Secondary | ICD-10-CM | POA: Diagnosis not present

## 2018-10-08 DIAGNOSIS — R600 Localized edema: Secondary | ICD-10-CM

## 2018-10-08 DIAGNOSIS — R739 Hyperglycemia, unspecified: Secondary | ICD-10-CM

## 2018-10-08 DIAGNOSIS — N926 Irregular menstruation, unspecified: Secondary | ICD-10-CM

## 2018-10-08 DIAGNOSIS — E669 Obesity, unspecified: Secondary | ICD-10-CM

## 2018-10-08 MED ORDER — HYDROCHLOROTHIAZIDE 12.5 MG PO TABS: ORAL_TABLET | ORAL | 1 refills | Status: DC

## 2018-10-08 MED ORDER — KETOCONAZOLE 2 % EX CREA: TOPICAL_CREAM | CUTANEOUS | 1 refills | Status: AC

## 2018-10-08 MED ORDER — PHENTERMINE HCL 37.5 MG PO TABS: 37.5000 mg | ORAL_TABLET | Freq: Every day | ORAL | 2 refills | Status: DC

## 2018-10-11 ENCOUNTER — Telehealth: Payer: Self-pay | Admitting: Family Medicine

## 2018-10-11 NOTE — Telephone Encounter (Signed)
See note

## 2018-10-11 NOTE — Telephone Encounter (Addendum)
Pt had an appt with dr Juleen China on Monday. Pt would like to have the lab order sent to her my chart or email so she can take to in network lab. Pt is out of town. Pt also is still waiting for email for  sleep study trest. Pt email address is [email or web address]

## 2018-10-11 NOTE — Telephone Encounter (Signed)
Called patient let her know I have questions about orders for wallace and will have to hold to get more information. That I will call back once I have everything lined up for lab and sleep study.

## 2018-10-14 ENCOUNTER — Encounter: Payer: Self-pay | Admitting: Family Medicine

## 2018-11-02 ENCOUNTER — Telehealth: Payer: Self-pay | Admitting: Genetic Counselor

## 2018-11-02 NOTE — Telephone Encounter (Signed)
Explained that her sample failed and we need to repeat the test.  We have set up mobile phlebotomy to get her blood drawn.

## 2018-11-09 ENCOUNTER — Encounter: Payer: Self-pay | Admitting: Gastroenterology

## 2018-12-07 ENCOUNTER — Telehealth: Payer: Self-pay

## 2018-12-07 ENCOUNTER — Encounter: Payer: Self-pay | Admitting: Family Medicine

## 2018-12-07 NOTE — Telephone Encounter (Signed)
Copied from [place] [id number]. Topic: General - Other >> Dec 07, 2018  4:12 PM Celene Kras A wrote: Reason for CRM: Pt called and is reqesting to speak with PCPs nurse. She states she needs to speak with her regarding blood test, and  sleep test.

## 2018-12-17 ENCOUNTER — Telehealth: Payer: Self-pay | Admitting: Genetic Counselor

## 2018-12-17 NOTE — Telephone Encounter (Signed)
Returned patient's phone call regarding rescheduling an appointment, left a voicemail. 

## 2018-12-19 ENCOUNTER — Inpatient Hospital Stay: Payer: 59 | Attending: Family Medicine

## 2018-12-19 ENCOUNTER — Other Ambulatory Visit (HOSPITAL_COMMUNITY)
Admission: RE | Admit: 2018-12-19 | Discharge: 2018-12-19 | Disposition: A | Discharge status: Home / Self Care | Payer: 59 | Source: Ambulatory Visit | Attending: Family Medicine | Admitting: Family Medicine

## 2018-12-19 ENCOUNTER — Other Ambulatory Visit: Payer: Self-pay

## 2018-12-19 ENCOUNTER — Encounter: Payer: Self-pay | Admitting: Family Medicine

## 2018-12-19 ENCOUNTER — Ambulatory Visit (INDEPENDENT_AMBULATORY_CARE_PROVIDER_SITE_OTHER): Payer: 59 | Admitting: Family Medicine

## 2018-12-19 ENCOUNTER — Encounter: Payer: Self-pay | Admitting: Physician Assistant

## 2018-12-19 ENCOUNTER — Ambulatory Visit (AMBULATORY_SURGERY_CENTER): Payer: 59 | Admitting: *Deleted

## 2018-12-19 VITALS — BP 140/60 | HR 84 | Temp 98.8°F | Ht 66.0 in | Wt 199.0 lb

## 2018-12-19 VITALS — Temp 97.1°F | Ht 66.0 in | Wt 200.2 lb

## 2018-12-19 DIAGNOSIS — E669 Obesity, unspecified: Secondary | ICD-10-CM

## 2018-12-19 DIAGNOSIS — Z0000 Encounter for general adult medical examination without abnormal findings: Secondary | ICD-10-CM

## 2018-12-19 DIAGNOSIS — Z124 Encounter for screening for malignant neoplasm of cervix: Secondary | ICD-10-CM | POA: Insufficient documentation

## 2018-12-19 DIAGNOSIS — Z23 Encounter for immunization: Secondary | ICD-10-CM

## 2018-12-19 DIAGNOSIS — R600 Localized edema: Secondary | ICD-10-CM

## 2018-12-19 DIAGNOSIS — Z1211 Encounter for screening for malignant neoplasm of colon: Secondary | ICD-10-CM

## 2018-12-19 DIAGNOSIS — Z1159 Encounter for screening for other viral diseases: Secondary | ICD-10-CM

## 2018-12-19 DIAGNOSIS — R635 Abnormal weight gain: Secondary | ICD-10-CM

## 2018-12-19 DIAGNOSIS — Z1239 Encounter for other screening for malignant neoplasm of breast: Secondary | ICD-10-CM

## 2018-12-19 DIAGNOSIS — F5101 Primary insomnia: Secondary | ICD-10-CM

## 2018-12-19 MED ORDER — NA SULFATE-K SULFATE-MG SULF 17.5-3.13-1.6 GM/177ML PO SOLN: ORAL | 0 refills | Status: DC

## 2018-12-19 NOTE — Progress Notes (Signed)
Patient had a "ruff time with anesthesia", she denies any complications, she was cold and stayed in recovery room a long time per pt.   Patient is here in-person for PV. Patient denies any allergies to eggs or soy. Patient denies any problems with anesthesia. Patient denies any oxygen use at home. Patient denies taking any blood thinners. Patient is taking Phentermine and is aware to stop 10 days before exam!! Patient is not being treated for MRSA or C-diff. EMMI education assisgned to patient on colonoscopy, this was explained and instructions given to patient. Suprep $15 off coupon given to pt.   Pt is aware that care partner will wait in the car during procedure; if they feel like they will be too hot to wait in the car; they may wait in the lobby. Patient is aware to bring only one care partner. We want them to wear a mask (we do not have any that we can provide them), practice social distancing, and we will check their temperatures when they get here.  I did remind patient that their care partner needs to stay in the parking lot the entire time. Pt will wear mask into building.

## 2018-12-19 NOTE — Progress Notes (Signed)
Subjective:    Starlena Pata is a 53 y.o. female and is here for a comprehensive physical exam.  Health Maintenance Due  Topic Date Due  . HIV Screening  08/08/1982  . TETANUS/TDAP  08/08/1986  . PAP SMEAR-Modifier  08/07/1988  . MAMMOGRAM  08/07/2017  . COLONOSCOPY  08/07/2017    Current Outpatient Medications:  .  albuterol (PROAIR HFA) 108 (90 Base) MCG/ACT inhaler, 2 puff ever 8 hours as needed., Disp: 2 Inhaler, Rfl: 3 .  hydrochlorothiazide (HYDRODIURIL) 12.5 MG tablet, hydrochlorothiazide 12.5 mg tablet, Disp: 90 tablet, Rfl: 1 .  ketoconazole (NIZORAL) 2 % cream, ketoconazole 2 % topical cream, Disp: 15 g, Rfl: 1 .  ketoconazole (NIZORAL) 2 % shampoo, USE AS DIRECTED, Disp: 120 mL, Rfl: 1 .  Na Sulfate-K Sulfate-Mg Sulf 17.5-3.13-1.6 GM/177ML SOLN, Suprep (no substitutions)-TAKE AS DIRECTED., Disp: 354 mL, Rfl: 0 .  phentermine (ADIPEX-P) 37.5 MG tablet, Take 1 tablet (37.5 mg total) by mouth daily before breakfast., Disp: 30 tablet, Rfl: 2 .  traZODone (DESYREL) 50 MG tablet, TAKE 1/2 TO 1 TABLET BY MOUTH EVERY DAY AT BEDTIME AS NEEDED FOR SLEEP., Disp: 90 tablet, Rfl: 2  PMHx, SurgHx, SocialHx, Medications, and Allergies were reviewed in the Visit Navigator and updated as appropriate.   Past Medical History:  Diagnosis Date  . Asthma   . Family history of breast cancer   . Family history of ovarian cancer   . Family history of prostate cancer      Past Surgical History:  Procedure Laterality Date  . head injury and cheek repair  1995  . LAPAROSCOPIC GASTRIC SLEEVE RESECTION  2015  . UPPER GASTROINTESTINAL ENDOSCOPY       Family History  Problem Relation Age of Onset  . Breast cancer Mother 32  . Asthma Father   . Heart attack Father   . Heart disease Father   . Prostate cancer Maternal Grandfather   . Mental illness Brother   . Ovarian cancer Maternal Aunt 70  . Head & neck cancer Maternal Uncle   . Skin cancer Maternal Aunt   . Breast cancer Cousin  17  . Skin cancer Cousin   . Skin cancer Cousin   . Breast cancer Paternal Aunt 37  . Breast cancer Paternal Aunt 56  . Colon cancer Neg Hx   . Colon polyps Neg Hx   . Esophageal cancer Neg Hx   . Stomach cancer Neg Hx   . Rectal cancer Neg Hx     Social History   Tobacco Use  . Smoking status: Never Smoker  . Smokeless tobacco: Never Used  Substance Use Topics  . Alcohol use: Yes    Alcohol/week: 4.0 - 5.0 standard drinks    Types: 4 - 5 Glasses of wine per week  . Drug use: Never    Review of Systems:   Pertinent items are noted in the HPI. Otherwise, ROS is negative.  Objective:   BP 140/60   Pulse 84   Temp 98.8 F (37.1 C) (Other (Comment))   Ht 5\' 6"  (1.676 m)   Wt 199 lb (90.3 kg)   LMP 08/20/2018 (Approximate)   SpO2 97%   BMI 32.12 kg/m   General appearance: alert, cooperative and appears stated age. Head: normocephalic, without obvious abnormality, atraumatic. Neck: no adenopathy, supple, symmetrical, trachea midline; thyroid not enlarged, symmetric, no tenderness/mass/nodules. Lungs: clear to auscultation bilaterally. Heart: regular rate and rhythm Abdomen: soft, non-tender; no masses,  no organomegaly. Extremities: extremities normal,  atraumatic, no cyanosis or edema. Skin: skin color, texture, turgor normal, no rashes or lesions. Lymph: cervical, supraclavicular, and axillary nodes normal; no abnormal inguinal nodes palpated. Neurologic: grossly normal.  Pelvic:  External genitalia: no lesions. Urethra: normal appearing urethra with no masses, tenderness or lesions. Bartholin's and Skene's: normal. Vagina: normal appearing vagina with normal color and discharge, no lesions. Cervix: normal appearance. Pap and high risk HPV testing done: Yes.   Uterus: uterus is normal size, shape, consistency and nontender. Adnexa: normal adnexa in size, nontender and no masses.                                      Assessment/Plan:   Trecie was seen today for  follow-up.  Diagnoses and all orders for this visit:  Routine physical examination  Need for immunization against influenza -     Flu Vaccine QUAD 36+ mos IM  Screening for cervical cancer -     Cytology - PAP  Encounter for breast cancer screening using non-mammogram modality -     MR BREAST BILATERAL W WO CONTRAST INC CAD; Future  Obesity (BMI 30.0-34.9)    Patient Counseling: [x]    Nutrition: Stressed importance of moderation in sodium/caffeine intake, saturated fat and cholesterol, caloric balance, sufficient intake of fresh fruits, vegetables, fiber, calcium, iron, and 1 mg of folate supplement per day (for females capable of pregnancy).  [x]    Stressed the importance of regular exercise.   [x]    Substance Abuse: Discussed cessation/primary prevention of tobacco, alcohol, or other drug use; driving or other dangerous activities under the influence; availability of treatment for abuse.   [x]    Injury prevention: Discussed safety belts, safety helmets, smoke detector, smoking near bedding or upholstery.   [x]    Sexuality: Discussed sexually transmitted diseases, partner selection, use of condoms, avoidance of unintended pregnancy  and contraceptive alternatives.  [x]    Dental health: Discussed importance of regular tooth brushing, flossing, and dental visits.  [x]    Health maintenance and immunizations reviewed. Please refer to Health maintenance section.   Briscoe Deutscher, DO [place]

## 2018-12-25 ENCOUNTER — Encounter: Payer: Self-pay | Admitting: Gastroenterology

## 2018-12-25 MED ORDER — PHENTERMINE HCL 37.5 MG PO TABS: 37.5000 mg | ORAL_TABLET | Freq: Every day | ORAL | 2 refills | Status: AC

## 2018-12-25 MED ORDER — HYDROCHLOROTHIAZIDE 12.5 MG PO TABS: ORAL_TABLET | ORAL | 1 refills | Status: DC

## 2018-12-25 MED ORDER — TRAZODONE HCL 50 MG PO TABS: ORAL_TABLET | ORAL | 2 refills | Status: AC

## 2018-12-26 ENCOUNTER — Encounter: Payer: Self-pay | Admitting: Family Medicine

## 2018-12-27 ENCOUNTER — Encounter: Payer: Self-pay | Admitting: Family Medicine

## 2018-12-27 ENCOUNTER — Ambulatory Visit: Payer: Self-pay | Admitting: Genetic Counselor

## 2018-12-27 ENCOUNTER — Encounter: Payer: Self-pay | Admitting: Genetic Counselor

## 2018-12-27 ENCOUNTER — Telehealth: Payer: Self-pay | Admitting: Genetic Counselor

## 2018-12-27 DIAGNOSIS — Z1379 Encounter for other screening for genetic and chromosomal anomalies: Secondary | ICD-10-CM

## 2018-12-27 LAB — CYTOLOGY - PAP
Adequacy: ABSENT
Comment: NEGATIVE
Diagnosis: NEGATIVE
High risk HPV: NEGATIVE

## 2018-12-27 NOTE — Telephone Encounter (Signed)
Revealed negative genetic testing.  Discussed that we do not know why there is cancer in the family. It could be due to a different gene that we are not testing, or maybe our current technology may not be able to pick something up.  It will be important for her to keep in contact with genetics to keep up with whether additional testing may be needed.  

## 2018-12-27 NOTE — Progress Notes (Signed)
HPI:  Ms. Curnutt was previously seen in the [facility] clinic due to a family history of breast and other cancers and concerns regarding a hereditary predisposition to cancer. Please refer to our prior cancer genetics clinic note for more information regarding our discussion, assessment and recommendations, at the time. Ms. Sergent recent genetic test results were disclosed to her, as were recommendations warranted by these results. These results and recommendations are discussed in more detail below.  CANCER HISTORY:  Oncology History   No history exists.    FAMILY HISTORY:  We obtained a detailed, 4-generation family history.  Significant diagnoses are listed below: Family History  Problem Relation Age of Onset  . Breast cancer Mother 25  . Asthma Father   . Heart attack Father   . Heart disease Father   . Prostate cancer Maternal Grandfather   . Mental illness Brother   . Ovarian cancer Maternal Aunt 70  . Head & neck cancer Maternal Uncle   . Skin cancer Maternal Aunt   . Breast cancer Cousin 54  . Skin cancer Cousin   . Skin cancer Cousin   . Breast cancer Paternal Aunt 78  . Breast cancer Paternal Aunt 57  . Colon cancer Neg Hx   . Colon polyps Neg Hx   . Esophageal cancer Neg Hx   . Stomach cancer Neg Hx   . Rectal cancer Neg Hx     The patient does not have children.  She has three brothers who are cancer free.  Her father is deceased and her mother is living.  The patient's father died of a heart attack.  He had tow brothers and three sisters.  Two sisters had breast cancer.  Both of his parents are deceased.  The patient's father died of a heart attack and his mother died of a non-malignant brain tumor.  His mother had two nieces who had brain tumors as well.  The patient's mother had breast cancer at 93.  She had three brothers and seven sisters.  One brother had a head/neck cancer, one sister had ovarian cancer, and one sister had skin cancer.   This last sister has a daughter who had breast cancer at 79.  The patient's maternal grandparents are deceased.  The grandfather had prostate cancer.  Ms. Guardiola is unaware of previous family history of genetic testing for hereditary cancer risks. Patient's maternal ancestors are of [place] descent, and paternal ancestors are of [place] descent. There is no reported Ashkenazi Jewish ancestry. There is no known consanguinity.    GENETIC TEST RESULTS: Genetic testing reported out on December 26, 2018 through the multi-cancer panel found no pathogenic mutations. The Multi-Gene Panel offered by [facility] includes sequencing and/or deletion duplication testing of the following 85 genes: AIP, ALK, APC, ATM, AXIN2,BAP1,  BARD1, BLM, BMPR1A, BRCA1, BRCA2, BRIP1, CASR, CDC73, CDH1, CDK4, CDKN1B, CDKN1C, CDKN2A (p14ARF), CDKN2A (p16INK4a), CEBPA, CHEK2, CTNNA1, DICER1, DIS3L2, EGFR (c.2369C>T, p.Thr790Met variant only), EPCAM (Deletion/duplication testing only), FH, FLCN, GATA2, GPC3, GREM1 (Promoter region deletion/duplication testing only), HOXB13 (c.251G>A, p.Gly84Glu), HRAS, KIT, MAX, MEN1, MET, MITF (c.952G>A, p.Glu318Lys variant only), MLH1, MSH2, MSH3, MSH6, MUTYH, NBN, NF1, NF2, NTHL1, PALB2, PDGFRA, PHOX2B, PMS2, POLD1, POLE, POT1, PRKAR1A, PTCH1, PTEN, RAD50, RAD51C, RAD51D, RB1, RECQL4, RET, RNF43, RUNX1, SDHAF2, SDHA (sequence changes only), SDHB, SDHC, SDHD, SMAD4, SMARCA4, SMARCB1, SMARCE1, STK11, SUFU, TERC, TERT, TMEM127, TP53, TSC1, TSC2, VHL, WRN and WT1.  The test report has been scanned into EPIC and is located under the Molecular  Pathology section of the Results Review tab.  A portion of the result report is included below for reference.     We discussed with Ms. Voris that because current genetic testing is not perfect, it is possible there may be a gene mutation in one of these genes that current testing cannot detect, but that chance is small.  We also discussed, that there could be another gene  that has not yet been discovered, or that we have not yet tested, that is responsible for the cancer diagnoses in the family. It is also possible there is a hereditary cause for the cancer in the family that Ms. Munce did not inherit and therefore was not identified in her testing.  Therefore, it is important to remain in touch with cancer genetics in the future so that we can continue to offer Ms. Farooq the most up to date genetic testing.   ADDITIONAL GENETIC TESTING: We discussed with Ms. Muto that her genetic testing was fairly extensive.  If there are genes identified to increase cancer risk that can be analyzed in the future, we would be happy to discuss and coordinate this testing at that time.    CANCER SCREENING RECOMMENDATIONS: Ms. Strehl test result is considered negative (normal).  This means that we have not identified a hereditary cause for her family history of cancer at this time. Most cancers happen by chance and this negative test suggests that her cancer may fall into this category.    While reassuring, this does not definitively rule out a hereditary predisposition to cancer. It is still possible that there could be genetic mutations that are undetectable by current technology. There could be genetic mutations in genes that have not been tested or identified to increase cancer risk.  Therefore, it is recommended she continue to follow the cancer management and screening guidelines provided by her  and primary healthcare provider.   An individual's cancer risk and medical management are not determined by genetic test results alone. Overall cancer risk assessment incorporates additional factors, including personal medical history, family history, and any available genetic information that may result in a personalized plan for cancer prevention and surveillance  Based on Ms. Minton's family of cancer, as well as her genetic test results, statistical models (Tyrer Cusik)  and  literature data were used to estimate her risk of developing breast cancer. These estimate her lifetime risk of developing breast cancer to be approximately 38.5%. (note: this risk is lower than the risk in June due to the patient reporting that she had lost weight.  This current T-C is using her new reported weight of 199). The patient's lifetime breast cancer risk is a preliminary estimate based on available information using one of several models endorsed by the [employer] (ACS). The ACS recommends consideration of breast MRI screening as an adjunct to mammography for patients at high risk (defined as 20% or greater lifetime risk). A more detailed breast cancer risk assessment can be considered, if clinically indicated.   Ms. Ke has been determined to be at high risk for breast cancer.  Therefore, we recommend that Ms. Clonch consider having an annual screening with mammography and breast MRI.  We also discussed that it is reasonable for Ms. Assefa to be followed by a high-risk breast cancer clinic; in addition to a yearly mammogram and physical exam by a healthcare provider, she should discuss the usefulness of an annual breast MRI with the high-risk clinic providers.We have referred her to  the high risk breast clinic at [facility].    RECOMMENDATIONS FOR FAMILY MEMBERS:  Individuals in this family might be at some increased risk of developing cancer, over the general population risk, simply due to the family history of cancer.  We recommended women in this family have a yearly mammogram beginning at age 36, or 68 years younger than the earliest onset of cancer, an annual clinical breast exam, and perform monthly breast self-exams. Women in this family should also have a gynecological exam as recommended by their primary provider. All family members should have a colonoscopy by age 65.  FOLLOW-UP: Lastly, we discussed with Ms. Linnemann that cancer genetics is a rapidly advancing field and it  is possible that new genetic tests will be appropriate for her and/or her family members in the future. We encouraged her to remain in contact with cancer genetics on an annual basis so we can update her personal and family histories and let her know of advances in cancer genetics that may benefit this family.   Our contact number was provided. Ms. Sethi questions were answered to her satisfaction, and she knows she is welcome to call us at anytime with additional questions or concerns.   Roma Kayser, [place], [facility] Licensed, Certified Genetic Counselor [email or web address]

## 2019-01-01 NOTE — Telephone Encounter (Signed)
When I spoke to pt about her pap smear results earlies she wanted to know whether she had PCOS or not. I told her I would have another provider review her labs and get back to her being as Dr. Juleen China has left. Pt verbalized understanding.   Called pt back and Left detailed message on personal voicemail, I had Dr. Jonni Sanger review your labs and she said there is no indication of PCOS with these labs. Any questions please call back.

## 2019-01-02 ENCOUNTER — Other Ambulatory Visit: Payer: Self-pay | Admitting: Gastroenterology

## 2019-01-02 ENCOUNTER — Encounter: Payer: 59 | Admitting: Gastroenterology

## 2019-01-03 LAB — SARS CORONAVIRUS 2 (TAT 6-24 HRS): SARS Coronavirus 2: NEGATIVE

## 2019-01-07 ENCOUNTER — Ambulatory Visit (AMBULATORY_SURGERY_CENTER): Payer: 59 | Admitting: Gastroenterology

## 2019-01-07 ENCOUNTER — Other Ambulatory Visit: Payer: Self-pay | Admitting: Gastroenterology

## 2019-01-07 ENCOUNTER — Other Ambulatory Visit: Payer: Self-pay

## 2019-01-07 ENCOUNTER — Encounter: Payer: Self-pay | Admitting: Gastroenterology

## 2019-01-07 VITALS — BP 133/82 | HR 62 | Temp 97.5°F | Resp 7 | Ht 66.0 in | Wt 200.0 lb

## 2019-01-07 DIAGNOSIS — D120 Benign neoplasm of cecum: Secondary | ICD-10-CM

## 2019-01-07 DIAGNOSIS — Z1211 Encounter for screening for malignant neoplasm of colon: Secondary | ICD-10-CM | POA: Diagnosis present

## 2019-01-07 DIAGNOSIS — D125 Benign neoplasm of sigmoid colon: Secondary | ICD-10-CM

## 2019-01-07 DIAGNOSIS — K635 Polyp of colon: Secondary | ICD-10-CM

## 2019-01-07 MED ORDER — SODIUM CHLORIDE 0.9 % IV SOLN: 500.0000 mL | Freq: Once | INTRAVENOUS | Status: DC

## 2019-01-07 NOTE — Progress Notes (Signed)
Wants to schedule an appointment for constipation.

## 2019-01-07 NOTE — Progress Notes (Signed)
Pt stable and tolerated well. Awake. To recovery

## 2019-01-07 NOTE — Progress Notes (Signed)
Called to room to assist during endoscopic procedure.  Patient ID and intended procedure confirmed with present staff. Received instructions for my participation in the procedure from the performing physician.  

## 2019-01-07 NOTE — Patient Instructions (Signed)
YOU HAD AN ENDOSCOPIC PROCEDURE TODAY AT THE [facility]:   Refer to the procedure report that was given to you for any specific questions about what was found during the examination.  If the procedure report does not answer your questions, please call your gastroenterologist to clarify.  If you requested that your care partner not be given the details of your procedure findings, then the procedure report has been included in a sealed envelope for you to review at your convenience later.  YOU SHOULD EXPECT: Some feelings of bloating in the abdomen. Passage of more gas than usual.  Walking can help get rid of the air that was put into your GI tract during the procedure and reduce the bloating. If you had a lower endoscopy (such as a colonoscopy or flexible sigmoidoscopy) you may notice spotting of blood in your stool or on the toilet paper. If you underwent a bowel prep for your procedure, you may not have a normal bowel movement for a few days.  Please Note:  You might notice some irritation and congestion in your nose or some drainage.  This is from the oxygen used during your procedure.  There is no need for concern and it should clear up in a day or so.  SYMPTOMS TO REPORT IMMEDIATELY:   Following lower endoscopy (colonoscopy or flexible sigmoidoscopy):  Excessive amounts of blood in the stool  Significant tenderness or worsening of abdominal pains  Swelling of the abdomen that is new, acute  Fever of 100F or higher  For urgent or emergent issues, a gastroenterologist can be reached at any hour by calling [phone].   DIET:  We do recommend a small meal at first, but then you may proceed to your regular diet.  Drink plenty of fluids but you should avoid alcoholic beverages for 24 hours.  ACTIVITY:  You should plan to take it easy for the rest of today and you should NOT DRIVE or use heavy machinery until tomorrow (because of the sedation medicines used during the test).     FOLLOW UP: Our staff will call the number listed on your records 48-72 hours following your procedure to check on you and address any questions or concerns that you may have regarding the information given to you following your procedure. If we do not reach you, we will leave a message.  We will attempt to reach you two times.  During this call, we will ask if you have developed any symptoms of COVID 19. If you develop any symptoms (ie: fever, flu-like symptoms, shortness of breath, cough etc.) before then, please call [phone].  If you test positive for Covid 19 in the 2 weeks post procedure, please call and report this information to us.    If any biopsies were taken you will be contacted by phone or by letter within the next 1-3 weeks.  Please call us at [phone] if you have not heard about the biopsies in 3 weeks.    SIGNATURES/CONFIDENTIALITY: You and/or your care partner have signed paperwork which will be entered into your electronic medical record.  These signatures attest to the fact that that the information above on your After Visit Summary has been reviewed and is understood.  Full responsibility of the confidentiality of this discharge information lies with you and/or your care-partner. 

## 2019-01-07 NOTE — Progress Notes (Signed)
Temp check by: YF Vital check by:CW  The patient states no changes in medical or surgical history since pre-visit screening on 12/19/2018.

## 2019-01-07 NOTE — Op Note (Signed)
[facility] Patient Name: Nancy Wyatt Procedure Date: 01/07/2019 1:23 PM MRN: [id number] Endoscopist: Mauri Pole , MD Age: 53 Referring MD:  Date of Birth: 1966-02-08 Gender: Female Account #: [id number] Procedure:                Colonoscopy Indications:              Screening for colorectal malignant neoplasm Medicines:                Monitored Anesthesia Care Procedure:                Pre-Anesthesia Assessment:                           - Prior to the procedure, a History and Physical                            was performed, and patient medications and                            allergies were reviewed. The patient's tolerance of                            previous anesthesia was also reviewed. The risks                            and benefits of the procedure and the sedation                            options and risks were discussed with the patient.                            All questions were answered, and informed consent                            was obtained. Prior Anticoagulants: The patient has                            taken no previous anticoagulant or antiplatelet                            agents. ASA Grade Assessment: II - A patient with                            mild systemic disease. After reviewing the risks                            and benefits, the patient was deemed in                            satisfactory condition to undergo the procedure.                           After obtaining informed consent, the colonoscope  was passed under direct vision. Throughout the                            procedure, the patient's blood pressure, pulse, and                            oxygen saturations were monitored continuously. The                            Colonoscope was introduced through the anus and                            advanced to the the cecum, identified by                            appendiceal orifice and  ileocecal valve. The                            colonoscopy was performed without difficulty. The                            patient tolerated the procedure well. The quality                            of the bowel preparation was good. The ileocecal                            valve, appendiceal orifice, and rectum were                            photographed. Scope In: 1:29:04 PM Scope Out: 1:51:51 PM Scope Withdrawal Time: 0 hours 18 minutes 9 seconds  Total Procedure Duration: 0 hours 22 minutes 47 seconds  Findings:                 The perianal and digital rectal examinations were                            normal.                           A 11 mm polyp was found in the cecum. The polyp was                            sessile. The polyp was removed with a hot snare.                            Resection and retrieval were complete.                           A 1 mm polyp was found in the sigmoid colon. The                            polyp was sessile. The polyp was removed with a  cold biopsy forceps. Resection and retrieval were                            complete.                           A few small and large-mouthed diverticula were                            found in the sigmoid colon, descending colon and                            transverse colon.                           Non-bleeding internal hemorrhoids were found during                            retroflexion. The hemorrhoids were small. Complications:            No immediate complications. Estimated Blood Loss:     Estimated blood loss was minimal. Impression:               - One 11 mm polyp in the cecum, removed with a hot                            snare. Resected and retrieved.                           - One 1 mm polyp in the sigmoid colon, removed with                            a cold biopsy forceps. Resected and retrieved.                           - Diverticulosis in the sigmoid colon,  in the                            descending colon and in the transverse colon.                           - Non-bleeding internal hemorrhoids. Recommendation:           - Patient has a contact number available for                            emergencies. The signs and symptoms of potential                            delayed complications were discussed with the                            patient. Return to normal activities tomorrow.                            Written discharge instructions  were provided to the                            patient.                           - Resume previous diet.                           - Continue present medications.                           - Await pathology results.                           - Repeat colonoscopy in 3 years for surveillance                            based on pathology results. Mauri Pole, MD 01/07/2019 1:57:14 PM This report has been signed electronically.

## 2019-01-09 ENCOUNTER — Telehealth: Payer: Self-pay

## 2019-01-09 MED ORDER — METRONIDAZOLE 500 MG PO TABS: 500.0000 mg | ORAL_TABLET | Freq: Two times a day (BID) | ORAL | 0 refills | Status: DC

## 2019-01-09 MED ORDER — CIPROFLOXACIN HCL 500 MG PO TABS: 500.0000 mg | ORAL_TABLET | Freq: Two times a day (BID) | ORAL | 0 refills | Status: DC

## 2019-01-09 NOTE — Telephone Encounter (Signed)
Called back patient. She has some mild right lower quadrant abdominal discomfort, no fever, nausea or vomiting.  She had a small bowel movement this afternoon with no blood. 1 cm polyp removed from cecum with hot snare.  Possible post polypectomy syndrome. Sent prescription for Cipro 500 mg twice daily and Flagyl 500 mg twice daily for 5 days and advised patient to stay on soft diet for next 5 to 7 days. She is currently in [place], advised her to call with any change in symptoms are seek medical attention from nearest hospital if needed.

## 2019-01-09 NOTE — Telephone Encounter (Signed)
First attempt follow up call to pt, lm on vm 

## 2019-01-09 NOTE — Telephone Encounter (Signed)
  Follow up Call-  Call back number 01/07/2019  Post procedure Call Back phone  # [phone]  Permission to leave phone message Yes     Patient questions:  Do you have a fever, pain , or abdominal swelling? Yes.   Pain Score  4 * States soreness and tenderness to RLQ.  Pt feels she is passing gas. Have you tolerated food without any problems? Yes.    Have you been able to return to your normal activities? Yes.    Do you have any questions about your discharge instructions: Diet   No. Medications  No. Follow up visit  No.  Do you have questions or concerns about your Care? Yes.   Pt has question of stool softener or fiber supplement on an ongoing basis as she had an issue with constipation in the past. Actions: * If pain score is 4 or above: Physician/ provider Notified : Harl Bowie, MD   1. Have you developed a fever since your procedure? no  2.   Have you had an respiratory symptoms (SOB or cough) since your procedure? no  3.   Have you tested positive for COVID 19 since your procedure no  4.   Have you had any family members/close contacts diagnosed with the COVID 19 since your procedure?  no   If yes to any of these questions please route to Joylene John, RN and Alphonsa Gin, Therapist, sports. Marland Kitchen

## 2019-01-11 ENCOUNTER — Telehealth: Payer: Self-pay | Admitting: Hematology and Oncology

## 2019-01-11 NOTE — Telephone Encounter (Signed)
Nancy Wyatt has been cld and scheduled for the high risk breast clinic on 12/7 at 1pm. Pt aware to arrive 15 minutes early.

## 2019-01-16 ENCOUNTER — Telehealth: Payer: Self-pay | Admitting: Gastroenterology

## 2019-01-17 NOTE — Telephone Encounter (Signed)
Called and spoke with patient- patient informed that results are released in [place] and Dr. Silverio Decamp will review these results and then the patient will receive a phone call with the next step in her plan of care- results are released to MyChart so they will know they have been completed and are to be reviewed by MD next- Patient appreciative of clarification of information and will await next phone call with results from [facility], [place];  Patient advised to call back to the office at [phone] should questions/concerns arise;  Patient verbalized understanding of information/instructions;

## 2019-01-18 ENCOUNTER — Telehealth: Payer: Self-pay | Admitting: Gastroenterology

## 2019-01-18 NOTE — Telephone Encounter (Signed)
Pt inquired about results of colonoscopy.  

## 2019-01-18 NOTE — Telephone Encounter (Signed)
Patient aware her pathology report is available because of her My Chart notifications. Advised Sessile Serrated polyp. No high grade dysplasia. No malignancy. The doctor will determine the significance and the recall interval.

## 2019-01-22 ENCOUNTER — Encounter: Payer: Self-pay | Admitting: Gastroenterology

## 2019-02-07 ENCOUNTER — Encounter: Payer: Self-pay | Admitting: *Deleted

## 2019-02-07 NOTE — Progress Notes (Signed)
Received call from pt requesting our office reach out to [facility] in [place] to request a CD image of past mammograms.  RN attempt x1 to contact facility [phone], no answer, LVM with pt information and our address to mail CD along with office call back number.

## 2019-02-10 NOTE — Progress Notes (Signed)
[facility] CONSULT NOTE  Patient Care Team: Briscoe Deutscher, DO as PCP - General (Family Medicine) Elsie Saas, MD as Consulting Physician (Orthopedic Surgery)  CHIEF COMPLAINTS/PURPOSE OF CONSULTATION:  Newly diagnosed high risk for breast cancer  HISTORY OF PRESENTING ILLNESS:  Nancy Wyatt 53 y.o. female is here because of recent diagnosis of high risk for breast cancer. Genetic testing in 12/2018 was negative. She has an extensive family history of cancer: breast cancer in her mother, two paternal aunts, and maternal aunt, and ovarian cancer in a maternal aunt. She presents to the clinic today for initial evaluation and discussion of surveillance options.  Patient had gastric bypass surgery and had lost weight initially but has now been gaining weight. 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  She plans to move to [place] to be with her mother while working remotely.  I reviewed her records extensively and collaborated the history with the patient.  MEDICAL HISTORY:  Past Medical History:  Diagnosis Date  . Asthma   . Family history of breast cancer   . Family history of ovarian cancer   . Family history of prostate cancer     SURGICAL HISTORY: Past Surgical History:  Procedure Laterality Date  . head injury and cheek repair  1995  . LAPAROSCOPIC GASTRIC SLEEVE RESECTION  2015  . UPPER GASTROINTESTINAL ENDOSCOPY      SOCIAL HISTORY: Social History   Socioeconomic History  . Marital status: Single    Spouse name: Not on file  . Number of children: Not on file  . Years of education: Not on file  . Highest education level: Not on file  Occupational History  . Not on file  Social Needs  . Financial resource strain: Not on file  . Food insecurity    Worry: Not on file    Inability: Not on file  . Transportation needs    Medical: Not on file    Non-medical: Not on file  Tobacco Use  . Smoking status:  Never Smoker  . Smokeless tobacco: Never Used  Substance and Sexual Activity  . Alcohol use: Yes    Alcohol/week: 4.0 - 5.0 standard drinks    Types: 4 - 5 Glasses of wine per week  . Drug use: Never  . Sexual activity: Yes    Partners: Male  Lifestyle  . Physical activity    Days per week: Not on file    Minutes per session: Not on file  . Stress: Not on file  Relationships  . Social Herbalist on phone: Not on file    Gets together: Not on file    Attends religious service: Not on file    Active member of club or organization: Not on file    Attends meetings of clubs or organizations: Not on file    Relationship status: Not on file  . Intimate partner violence    Fear of current or ex partner: Not on file    Emotionally abused: Not on file    Physically abused: Not on file    Forced sexual activity: Not on file  Other Topics Concern  . Not on file  Social History Narrative  . Not on file    FAMILY HISTORY: Family History  Problem Relation Age of Onset  . Breast cancer Mother 66  . Asthma Father   . Heart attack Father   . Heart disease Father   . Prostate cancer  Maternal Grandfather   . Mental illness Brother   . Ovarian cancer Maternal Aunt 70  . Head & neck cancer Maternal Uncle   . Skin cancer Maternal Aunt   . Breast cancer Cousin 19  . Skin cancer Cousin   . Skin cancer Cousin   . Breast cancer Paternal Aunt 68  . Breast cancer Paternal Aunt 4  . Colon cancer Neg Hx   . Colon polyps Neg Hx   . Esophageal cancer Neg Hx   . Stomach cancer Neg Hx   . Rectal cancer Neg Hx     ALLERGIES:  is allergic to sulfamethoxazole.  MEDICATIONS:  Current Outpatient Medications  Medication Sig Dispense Refill  . albuterol (PROAIR HFA) 108 (90 Base) MCG/ACT inhaler 2 puff ever 8 hours as needed. 2 Inhaler 3  . hydrochlorothiazide (HYDRODIURIL) 12.5 MG tablet hydrochlorothiazide 12.5 mg tablet 90 tablet 1  . ketoconazole (NIZORAL) 2 % cream ketoconazole  2 % topical cream 15 g 1  . ketoconazole (NIZORAL) 2 % shampoo USE AS DIRECTED 120 mL 1  . metroNIDAZOLE (FLAGYL) 500 MG tablet Take 1 tablet (500 mg total) by mouth 2 (two) times daily. 10 tablet 0  . phentermine (ADIPEX-P) 37.5 MG tablet Take 1 tablet (37.5 mg total) by mouth daily before breakfast. 30 tablet 2  . tamoxifen (NOLVADEX) 10 MG tablet Take 1 tablet (10 mg total) by mouth daily. 90 tablet 3  . traZODone (DESYREL) 50 MG tablet TAKE 1/2 TO 1 TABLET BY MOUTH EVERY DAY AT BEDTIME AS NEEDED FOR SLEEP. 90 tablet 2   No current facility-administered medications for this visit.     REVIEW OF SYSTEMS:   Constitutional: Denies fevers, chills or abnormal night sweats Eyes: Denies blurriness of vision, double vision or watery eyes Ears, nose, mouth, throat, and face: Denies mucositis or sore throat Respiratory: Denies cough, dyspnea or wheezes Cardiovascular: Denies palpitation, chest discomfort or lower extremity swelling Gastrointestinal:  Denies nausea, heartburn or change in bowel habits Skin: Denies abnormal skin rashes Lymphatics: Denies new lymphadenopathy or easy bruising Neurological:Denies numbness, tingling or new weaknesses Behavioral/Psych: Mood is stable, no new changes  Breast: Denies any palpable lumps or discharge All other systems were reviewed with the patient and are negative.  PHYSICAL EXAMINATION: ECOG PERFORMANCE STATUS: 0 - Asymptomatic  Vitals:   02/11/19 1322  BP: 121/62  Pulse: 70  Resp: 19  Temp: 97.8 F (36.6 C)  SpO2: 100%   Filed Weights   02/11/19 1322  Weight: 213 lb 11.2 oz (96.9 kg)    GENERAL:alert, no distress and comfortable SKIN: skin color, texture, turgor are normal, no rashes or significant lesions EYES: normal, conjunctiva are pink and non-injected, sclera clear OROPHARYNX:no exudate, no erythema and lips, buccal mucosa, and tongue normal  NECK: supple, thyroid normal size, non-tender, without nodularity LYMPH:  no palpable  lymphadenopathy in the cervical, axillary or inguinal LUNGS: clear to auscultation and percussion with normal breathing effort HEART: regular rate & rhythm and no murmurs and no lower extremity edema ABDOMEN:abdomen soft, non-tender and normal bowel sounds Musculoskeletal:no cyanosis of digits and no clubbing  PSYCH: alert & oriented x 3 with fluent speech NEURO: no focal motor/sensory deficits BREAST: No palpable nodules in breast. No palpable axillary or supraclavicular lymphadenopathy (exam performed in the presence of a chaperone)   LABORATORY DATA:  I have reviewed the data as listed Lab Results  Component Value Date   WBC 5.6 05/21/2018   HGB 14.2 05/21/2018   HCT 42.6 05/21/2018  MCV 91.3 05/21/2018   PLT 227.0 05/21/2018   Lab Results  Component Value Date   NA 141 05/21/2018   K 4.7 05/21/2018   CL 105 05/21/2018   CO2 29 05/21/2018    RADIOGRAPHIC STUDIES: I have personally reviewed the radiological reports and agreed with the findings in the report.  ASSESSMENT AND PLAN:  At high risk for breast cancer Tyrer Cusik lifetime risk of developing breast cancer: 38.5% Family history: Patient's mother had breast cancer age 4.  63 brothers and 7 sisters.  1 brother had head and neck cancer, 1 sister with ovarian cancer.  Her niece had breast cancer at age 83.,  Grandfather had prostate cancer. Genetic testing: Negative for mutations.  12/26/2018  Recommendation: 1.  Annual mammograms 2.  Breast MRIs 3.  Annual breast exams  Breast cancer risk reduction: We discussed the pharmacological and nonpharmacological risk reduction measures. Nonpharmacological measures: Diet, exercise, avoiding alcohol, decrease red meat, increasing fruits and vegetables. Pharmacological measures: I discussed the risks and benefits of tamoxifen.  I discussed with her that tamoxifen would reduce the risk of breast cancer recurrence by 50%.  We discussed the risks and benefits of tamoxifen.  These include but not limited to insomnia, hot flashes, mood changes, vaginal dryness, and weight gain. Although rare, serious side effects including endometrial cancer, risk of blood clots were also discussed. We strongly believe that the benefits far outweigh the risks. Patient understands these risks and consented to starting treatment. Planned treatment duration is 5 years. I discussed with her that based upon TAM-01 clinical trial, we can start her at 10 mg a day.  She is concerned about the risks of uterine cancer.  Patient also is contemplating on breast reduction surgery to reduce her risk of breast cancer.  If she is keen on it then we can refer her to plastic surgery.  Patient has an appointment to see do mammograms 02/15/2019.  We are trying to schedule her breast MRI immediately.  I will see her back in 3 months to evaluate side effects to tamoxifen.  All questions were answered. The patient knows to call the clinic with any problems, questions or concerns.   Rulon Eisenmenger, MD, MPH 02/11/2019    I, Molly Dorshimer, am acting as scribe for Nicholas Lose, MD.  I have reviewed the above documentation for accuracy and completeness, and I agree with the above.

## 2019-02-11 ENCOUNTER — Other Ambulatory Visit: Payer: Self-pay

## 2019-02-11 ENCOUNTER — Inpatient Hospital Stay: Payer: 59 | Attending: Family Medicine | Admitting: Hematology and Oncology

## 2019-02-11 DIAGNOSIS — Z9189 Other specified personal risk factors, not elsewhere classified: Secondary | ICD-10-CM | POA: Diagnosis not present

## 2019-02-11 DIAGNOSIS — Z9884 Bariatric surgery status: Secondary | ICD-10-CM | POA: Diagnosis not present

## 2019-02-11 DIAGNOSIS — Z79899 Other long term (current) drug therapy: Secondary | ICD-10-CM | POA: Diagnosis not present

## 2019-02-11 DIAGNOSIS — J45909 Unspecified asthma, uncomplicated: Secondary | ICD-10-CM | POA: Insufficient documentation

## 2019-02-11 DIAGNOSIS — R635 Abnormal weight gain: Secondary | ICD-10-CM | POA: Diagnosis not present

## 2019-02-11 DIAGNOSIS — R634 Abnormal weight loss: Secondary | ICD-10-CM | POA: Diagnosis not present

## 2019-02-11 DIAGNOSIS — Z803 Family history of malignant neoplasm of breast: Secondary | ICD-10-CM | POA: Insufficient documentation

## 2019-02-11 DIAGNOSIS — Z8041 Family history of malignant neoplasm of ovary: Secondary | ICD-10-CM | POA: Diagnosis not present

## 2019-02-11 MED ORDER — TAMOXIFEN CITRATE 10 MG PO TABS: 10.0000 mg | ORAL_TABLET | Freq: Every day | ORAL | 3 refills | Status: DC

## 2019-02-11 NOTE — Assessment & Plan Note (Signed)
Tyrer Cusik lifetime risk of developing breast cancer: 38.5% Family history: Patient's mother had breast cancer age 51.  11 brothers and 7 sisters.  1 brother had head and neck cancer, 1 sister with ovarian cancer.  Her niece had breast cancer at age 23.,  Grandfather had prostate cancer. Genetic testing: Negative for mutations.  12/26/2018  Recommendation: 1.  Annual mammograms 2.  Breast MRIs 3.  Annual breast exams  Return to clinic once here for breast exams.  We will order mammograms and MRIs to alternate by 6 months.

## 2019-02-12 ENCOUNTER — Telehealth: Payer: Self-pay | Admitting: Hematology and Oncology

## 2019-02-12 NOTE — Telephone Encounter (Signed)
I talk with patient regarding schedule  

## 2019-02-15 ENCOUNTER — Other Ambulatory Visit: Payer: Self-pay

## 2019-02-15 ENCOUNTER — Ambulatory Visit
Admission: RE | Admit: 2019-02-15 | Discharge: 2019-02-15 | Disposition: A | Discharge status: Home / Self Care | Payer: 59 | Source: Ambulatory Visit | Attending: Family Medicine | Admitting: Family Medicine

## 2019-02-15 DIAGNOSIS — Z803 Family history of malignant neoplasm of breast: Secondary | ICD-10-CM

## 2019-02-19 ENCOUNTER — Telehealth: Payer: Self-pay | Admitting: General Practice

## 2019-02-19 ENCOUNTER — Telehealth: Payer: Self-pay | Admitting: Hematology and Oncology

## 2019-02-19 NOTE — Telephone Encounter (Signed)
Called Nancy Wyatt to schedule appt. Nancy Wyatt stated she wanted to get advice from Dr. Hollace Hayward on which provider to transfer to. Nancy Wyatt stated she would call back to schedule.

## 2019-02-19 NOTE — Telephone Encounter (Signed)
FAXED NOTES TO [facility] [phone]

## 2019-03-28 ENCOUNTER — Other Ambulatory Visit: Payer: Self-pay | Admitting: General Practice

## 2019-03-28 DIAGNOSIS — Z1239 Encounter for other screening for malignant neoplasm of breast: Secondary | ICD-10-CM

## 2019-03-29 ENCOUNTER — Ambulatory Visit
Admission: RE | Admit: 2019-03-29 | Discharge: 2019-03-29 | Disposition: A | Discharge status: Home / Self Care | Payer: 59 | Source: Ambulatory Visit | Attending: Family Medicine | Admitting: Family Medicine

## 2019-03-29 ENCOUNTER — Other Ambulatory Visit: Payer: Self-pay | Admitting: *Deleted

## 2019-03-29 ENCOUNTER — Other Ambulatory Visit: Payer: Self-pay

## 2019-03-29 ENCOUNTER — Other Ambulatory Visit: Payer: 59

## 2019-03-29 ENCOUNTER — Other Ambulatory Visit: Payer: Self-pay | Admitting: Hematology and Oncology

## 2019-03-29 DIAGNOSIS — Z1239 Encounter for other screening for malignant neoplasm of breast: Secondary | ICD-10-CM

## 2019-03-29 DIAGNOSIS — R9389 Abnormal findings on diagnostic imaging of other specified body structures: Secondary | ICD-10-CM

## 2019-03-29 DIAGNOSIS — Z9189 Other specified personal risk factors, not elsewhere classified: Secondary | ICD-10-CM

## 2019-03-29 MED ORDER — GADOBUTROL 1 MMOL/ML IV SOLN
10.0000 mL | Freq: Once | INTRAVENOUS | Status: AC | PRN
  Administered 2019-03-29: 10 mL via INTRAVENOUS

## 2019-03-30 ENCOUNTER — Other Ambulatory Visit: Payer: Self-pay | Admitting: Hematology and Oncology

## 2019-03-30 ENCOUNTER — Telehealth: Payer: Self-pay | Admitting: Hematology and Oncology

## 2019-03-30 MED ORDER — TAMOXIFEN CITRATE 20 MG PO TABS: 20.0000 mg | ORAL_TABLET | Freq: Every day | ORAL | 3 refills | Status: DC

## 2019-03-30 NOTE — Telephone Encounter (Signed)
I reviewed the MRI report with the patient who has read it yesterday and was panicking about the results and findings.  The MRI showed a 6 mm indeterminate mass and the recommendation is to biopsy this.  Biopsy appointment was made.  No appointments were available until 04/12/2019. She is driving to [place] to take care of her mother and will look to see if there is any other appointments available in [place].  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 She was taking 10 mg of tamoxifen since a week ago and would like to take 20 mg full dose.  I suggested that she take 210 mg tablets at this time and then I sent a new prescription for the 20 mg to her pharmacy.

## 2019-04-05 ENCOUNTER — Telehealth: Payer: Self-pay | Admitting: *Deleted

## 2019-04-05 NOTE — Telephone Encounter (Signed)
Received VM from pt, attempt x1 to return call, no answer, LVM to return call to the office.  

## 2019-04-12 ENCOUNTER — Ambulatory Visit
Admission: RE | Admit: 2019-04-12 | Discharge: 2019-04-12 | Disposition: A | Discharge status: Home / Self Care | Payer: 59 | Source: Ambulatory Visit | Attending: Hematology and Oncology | Admitting: Hematology and Oncology

## 2019-04-12 ENCOUNTER — Other Ambulatory Visit (HOSPITAL_COMMUNITY): Payer: Self-pay | Admitting: Diagnostic Radiology

## 2019-04-12 ENCOUNTER — Other Ambulatory Visit: Payer: Self-pay

## 2019-04-12 DIAGNOSIS — Z9189 Other specified personal risk factors, not elsewhere classified: Secondary | ICD-10-CM

## 2019-04-12 DIAGNOSIS — R9389 Abnormal findings on diagnostic imaging of other specified body structures: Secondary | ICD-10-CM

## 2019-04-12 MED ORDER — GADOBUTROL 1 MMOL/ML IV SOLN
10.0000 mL | Freq: Once | INTRAVENOUS | Status: AC | PRN
  Administered 2019-04-12: 09:00:00 10 mL via INTRAVENOUS

## 2019-04-15 NOTE — Progress Notes (Signed)
Patient Care Team: Patient, No Pcp Per as PCP - General (General Practice) Elsie Saas, MD as Consulting Physician (Orthopedic Surgery)  DIAGNOSIS:    ICD-10-CM   1. At high risk for breast cancer  Z91.89     CHIEF COMPLIANT: Follow-up of recent breast biopsy   INTERVAL HISTORY: Nancy Wyatt is a 54 y.o. with above-mentioned history of high risk for breast cancer. Breast MRI on 03/29/19 showed a 0.6cm indeterminate mass in the right breast. She underwent a biopsy on 04/12/19 for which pathology is pending. She presents to the clinic today for follow-up.   ALLERGIES:  is allergic to sulfamethoxazole.  MEDICATIONS:  Current Outpatient Medications  Medication Sig Dispense Refill  . albuterol (PROAIR HFA) 108 (90 Base) MCG/ACT inhaler 2 puff ever 8 hours as needed. 2 Inhaler 3  . hydrochlorothiazide (HYDRODIURIL) 12.5 MG tablet hydrochlorothiazide 12.5 mg tablet 90 tablet 1  . ketoconazole (NIZORAL) 2 % cream ketoconazole 2 % topical cream 15 g 1  . ketoconazole (NIZORAL) 2 % shampoo USE AS DIRECTED 120 mL 1  . metroNIDAZOLE (FLAGYL) 500 MG tablet Take 1 tablet (500 mg total) by mouth 2 (two) times daily. 10 tablet 0  . phentermine (ADIPEX-P) 37.5 MG tablet Take 1 tablet (37.5 mg total) by mouth daily before breakfast. 30 tablet 2  . tamoxifen (NOLVADEX) 20 MG tablet Take 1 tablet (20 mg total) by mouth daily. 90 tablet 3  . traZODone (DESYREL) 50 MG tablet TAKE 1/2 TO 1 TABLET BY MOUTH EVERY DAY AT BEDTIME AS NEEDED FOR SLEEP. 90 tablet 2   No current facility-administered medications for this visit.    PHYSICAL EXAMINATION: ECOG PERFORMANCE STATUS: 1 - Symptomatic but completely ambulatory  There were no vitals filed for this visit. There were no vitals filed for this visit.  LABORATORY DATA:  I have reviewed the data as listed CMP Latest Ref Rng & Units 05/21/2018  Glucose 70 - 99 mg/dL 88  BUN 6 - 23 mg/dL 10  Creatinine 0.40 - 1.20 mg/dL 0.94  Sodium 135 - 145 mEq/L  141  Potassium 3.5 - 5.1 mEq/L 4.7  Chloride 96 - 112 mEq/L 105  CO2 19 - 32 mEq/L 29  Calcium 8.4 - 10.5 mg/dL 9.2  Total Protein 6.0 - 8.3 g/dL 6.7  Total Bilirubin 0.2 - 1.2 mg/dL 0.4  Alkaline Phos 39 - 117 U/L 68  AST 0 - 37 U/L 15  ALT 0 - 35 U/L 18    Lab Results  Component Value Date   WBC 5.6 05/21/2018   HGB 14.2 05/21/2018   HCT 42.6 05/21/2018   MCV 91.3 05/21/2018   PLT 227.0 05/21/2018   NEUTROABS 3.2 05/21/2018    ASSESSMENT & PLAN:  At high risk for breast cancer Tyrer Cusik lifetime risk of developing breast cancer: 38.5% Family history: Patient's mother had breast cancer age 34.  3 brothers and 7 sisters.  1 brother had head and neck cancer, 1 sister with ovarian cancer.  Her niece had breast cancer at age 64.,  Grandfather had prostate cancer. Genetic testing: Negative for mutations.  12/26/2018  Recommendation: 1.  Annual mammograms 02/15/2019: Benign 2.  Breast MRIs 03/29/2019: 6 mm right breast mass: Biopsy 04/12/2019: Fibrocystic change with apocrine metaplasia 3.  Annual breast exams --------------------------------------------------------------------------------------------------------- Pathology review: Fibrocystic changes with apocrine metaplasia: Benign I informed the patient the results of pathology and she was very excited to hear that. She is still extremely anxious about these findings and therefore we will repeat  another MRI in 50months and follow-up after that.  She started taking tamoxifen 20 mg and is tolerating it extremely well.    No orders of the defined types were placed in this encounter.  The patient has a good understanding of the overall plan. she agrees with it. she will call with any problems that may develop before the next visit here.  Total time spent: 20 mins including face to face time and time spent for planning, charting and coordination of care  Nicholas Lose, MD 04/16/2019  I, Cloyde Reams Dorshimer, am acting as scribe for  Dr. Nicholas Lose.  I have reviewed the above documentation for accuracy and completeness, and I agree with the above.

## 2019-04-16 ENCOUNTER — Encounter: Payer: Self-pay | Admitting: Hematology and Oncology

## 2019-04-16 ENCOUNTER — Inpatient Hospital Stay: Payer: 59 | Attending: Family Medicine | Admitting: Hematology and Oncology

## 2019-04-16 DIAGNOSIS — Z9189 Other specified personal risk factors, not elsewhere classified: Secondary | ICD-10-CM | POA: Diagnosis not present

## 2019-04-16 NOTE — Assessment & Plan Note (Signed)
Tyrer Cusik lifetime risk of developing breast cancer: 38.5% Family history: Patient's mother had breast cancer age 52.  73 brothers and 7 sisters.  1 brother had head and neck cancer, 1 sister with ovarian cancer.  Her niece had breast cancer at age 58.,  Grandfather had prostate cancer. Genetic testing: Negative for mutations.  12/26/2018  Recommendation: 1.  Annual mammograms 02/15/2019: Benign 2.  Breast MRIs 03/29/2019: 6 mm right breast mass: Biopsy 04/12/2019: 3.  Annual breast exams --------------------------------------------------------------------------------------------------------- Pathology review:

## 2019-04-17 ENCOUNTER — Telehealth: Payer: Self-pay | Admitting: Hematology and Oncology

## 2019-04-17 NOTE — Telephone Encounter (Signed)
I left a message regarding schedule  

## 2019-04-23 ENCOUNTER — Encounter: Payer: 59 | Admitting: Physician Assistant

## 2019-04-25 ENCOUNTER — Encounter: Payer: Self-pay | Admitting: *Deleted

## 2019-04-25 NOTE — Progress Notes (Signed)
Received fax from [facility] requesting consent from MD.  Pt requesting second opinion from [facility] and MD aware and signed consent forum.  Successfully faxed back to [phone].

## 2019-05-13 ENCOUNTER — Telehealth: Payer: 59 | Admitting: Hematology and Oncology

## 2019-05-15 ENCOUNTER — Encounter: Payer: Self-pay | Admitting: Family Medicine

## 2019-05-16 ENCOUNTER — Encounter: Payer: Self-pay | Admitting: Family Medicine

## 2019-05-16 ENCOUNTER — Ambulatory Visit (INDEPENDENT_AMBULATORY_CARE_PROVIDER_SITE_OTHER): Payer: 59 | Admitting: Family Medicine

## 2019-05-16 DIAGNOSIS — J4520 Mild intermittent asthma, uncomplicated: Secondary | ICD-10-CM | POA: Diagnosis not present

## 2019-05-16 DIAGNOSIS — G4700 Insomnia, unspecified: Secondary | ICD-10-CM | POA: Insufficient documentation

## 2019-05-16 DIAGNOSIS — J45909 Unspecified asthma, uncomplicated: Secondary | ICD-10-CM | POA: Insufficient documentation

## 2019-05-16 NOTE — Progress Notes (Signed)
Patient: Nancy Wyatt MRN: [id number] DOB: [birth year] PCP: Patient, No Pcp Per     I connected with Tedra Coupe on 05/16/19 at 9:20am by a video enabled telemedicine application and verified that I am speaking with the correct person using two identifiers.  Location patient: Home Location provider: [facility], Office Persons participating in this virtual visit: Aaminah Wiebenga and Dr. Rogers Blocker   I discussed the limitations of evaluation and management by telemedicine and the availability of in person appointments. The patient expressed understanding and agreed to proceed.   Subjective:  Chief Complaint  Patient presents with  . Immunizations    Covi-19 vaccination approval     HPI: The patient is a 54 y.o. female who presents today for needs form filled out to receive Covid Vaccination. Patient has Sent form in My chart and we have printed out for signature. I have never seen this patient before and she has a transfer of care appointment with me in April. We will review her medical history and chart today as well.   Review of Systems  Constitutional: Negative for chills, fatigue and fever.  HENT: Negative for dental problem, ear pain, hearing loss and trouble swallowing.   Eyes: Negative for visual disturbance.  Respiratory: Negative for cough, chest tightness and shortness of breath.   Cardiovascular: Negative for chest pain, palpitations and leg swelling.  Gastrointestinal: Negative for abdominal pain, blood in stool, diarrhea and nausea.  Endocrine: Negative for cold intolerance, polydipsia, polyphagia and polyuria.  Genitourinary: Negative for dysuria and hematuria.  Musculoskeletal: Negative for arthralgias.  Skin: Negative for rash.  Neurological: Negative for dizziness and headaches.  Psychiatric/Behavioral: Negative for dysphoric mood and sleep disturbance. The patient is not nervous/anxious.     Allergies Patient is allergic to sulfamethoxazole.  Past Medical  History Patient  has a past medical history of Asthma, Family history of breast cancer, Family history of ovarian cancer, and Family history of prostate cancer.  Surgical History Patient  has a past surgical history that includes head injury and cheek repair (1995); Laparoscopic gastric sleeve resection (2015); and Upper gastrointestinal endoscopy.  Family History Pateint's family history includes Asthma in her father; Breast cancer (age of onset: 60) in her cousin; Breast cancer (age of onset: 36) in her mother; Breast cancer (age of onset: 31) in her paternal aunt; Breast cancer (age of onset: 57) in her paternal aunt; Head & neck cancer in her maternal uncle; Heart attack in her father; Heart disease in her father; Mental illness in her brother; Ovarian cancer (age of onset: 62) in her maternal aunt; Prostate cancer in her maternal grandfather; Skin cancer in her cousin, cousin, and maternal aunt.  Social History Patient  reports that she has never smoked. She has never used smokeless tobacco. She reports current alcohol use of about 4.0 - 5.0 standard drinks of alcohol per week. She reports that she does not use drugs.    Objective: Vitals:   05/16/19 0901  Weight: 201 lb (91.2 kg)  Height: 5\' 6"  (1.676 m)    Body mass index is 32.44 kg/m.  Physical Exam Vitals reviewed.  Constitutional:      Appearance: Normal appearance. She is obese.  HENT:     Head: Normocephalic and atraumatic.  Pulmonary:     Effort: Pulmonary effort is normal.  Neurological:     General: No focal deficit present.     Mental Status: She is alert and oriented to person, place, and time.  Psychiatric:  Mood and Affect: Mood normal.        Behavior: Behavior normal.        Assessment/plan: 1. Mild intermittent asthma without complication Obesity +asthma. Form filled out for covid vaccine. Will me mailed and faxed if she finds a fax number.   Has TOC in April with me. Chart reviewed including  allergies, medical history, medication and HM.     Return if symptoms worsen or fail to improve.  Records requested if needed. Time spent with patient: 20 minutes, of which >50% was spent in obtaining information about her symptoms, reviweing her previous labs, evaluations, and treatments, counseling her about her conditions (please see discussed topics above), and developing a plan to further investigate it; she had a number of questions which I addressed.    Orma Flaming, MD [place]  05/16/2019

## 2019-05-17 ENCOUNTER — Other Ambulatory Visit: Payer: Self-pay | Admitting: Family Medicine

## 2019-05-17 DIAGNOSIS — R600 Localized edema: Secondary | ICD-10-CM

## 2019-05-17 NOTE — Telephone Encounter (Signed)
Sent my chart to see if she needs any refills.

## 2019-05-20 NOTE — Telephone Encounter (Signed)
Please review

## 2019-05-22 ENCOUNTER — Other Ambulatory Visit: Payer: Self-pay

## 2019-05-22 DIAGNOSIS — R600 Localized edema: Secondary | ICD-10-CM

## 2019-05-22 MED ORDER — HYDROCHLOROTHIAZIDE 12.5 MG PO TABS: ORAL_TABLET | ORAL | 1 refills | Status: AC

## 2019-07-01 ENCOUNTER — Encounter: Payer: 59 | Admitting: Family Medicine

## 2019-07-01 DIAGNOSIS — Z0289 Encounter for other administrative examinations: Secondary | ICD-10-CM

## 2019-08-15 ENCOUNTER — Encounter: Payer: Self-pay | Admitting: General Practice

## 2019-09-27 ENCOUNTER — Telehealth: Payer: Self-pay | Admitting: Hematology and Oncology

## 2019-09-29 ENCOUNTER — Other Ambulatory Visit: Payer: Self-pay | Admitting: Family Medicine

## 2019-09-29 DIAGNOSIS — R635 Abnormal weight gain: Secondary | ICD-10-CM

## 2019-09-29 NOTE — Progress Notes (Signed)
HEMATOLOGY-ONCOLOGY MYCHART VIDEO VISIT PROGRESS NOTE  I connected with Nancy Wyatt on 09/30/2019 at  9:30 AM EDT by MyChart video conference and verified that I am speaking with the correct person using two identifiers.  I discussed the limitations, risks, security and privacy concerns of performing an evaluation and management service by MyChart and the availability of in person appointments.  I also discussed with the patient that there may be a patient responsible charge related to this service. The patient expressed understanding and agreed to proceed.  Patient's Location: Home Physician Location: Clinic  CHIEF COMPLIANT: Follow-up of high risk for breast cancer on tamoxifen  INTERVAL HISTORY: Nancy Wyatt is a 54 y.o. female with above-mentioned history of high risk for breast cancer currently on tamoxifen.. She presents over MyChart todayfor follow-up.   We were unable to connect through [facility] and we did a telephone visit instead.  Her major question today is related to her weight issues.  She has been on an aggressive weight loss regimen that includes food and exercise and in spite of that she has not lost much weight and she is frustrated about it.  She even has a nutritionist and therapist helping her out.  She wonders if it is related to tamoxifen.  Other than that she is not having any other side effects.   Observations/Objective:  There were no vitals filed for this visit. There is no height or weight on file to calculate BMI.  I have reviewed the data as listed CMP Latest Ref Rng & Units 05/21/2018  Glucose 70 - 99 mg/dL 88  BUN 6 - 23 mg/dL 10  Creatinine 0.40 - 1.20 mg/dL 0.94  Sodium 135 - 145 mEq/L 141  Potassium 3.5 - 5.1 mEq/L 4.7  Chloride 96 - 112 mEq/L 105  CO2 19 - 32 mEq/L 29  Calcium 8.4 - 10.5 mg/dL 9.2  Total Protein 6.0 - 8.3 g/dL 6.7  Total Bilirubin 0.2 - 1.2 mg/dL 0.4  Alkaline Phos 39 - 117 U/L 68  AST 0 - 37 U/L 15  ALT 0 - 35 U/L 18    Lab  Results  Component Value Date   WBC 5.6 05/21/2018   HGB 14.2 05/21/2018   HCT 42.6 05/21/2018   MCV 91.3 05/21/2018   PLT 227.0 05/21/2018   NEUTROABS 3.2 05/21/2018      Assessment Plan:  At high risk for breast cancer [place]us[place]etime risk of developing breast cancer: 38.5% Family history: Patient's mother had breast cancer age 27. 28 brothers and 7 sisters. 1 brother had head and neck cancer, 1 sister with ovarian cancer. Her niece had breast cancer at age 48., Grandfather had prostate cancer. Genetic testing: Negative for mutations.12/26/2018  Recommendation: 1.Annual mammograms 02/15/2019: Benign 2.Breast MRIs 03/29/2019: 6 mm right breast mass: Biopsy 04/12/2019: Fibrocystic change with apocrine metaplasia 3.Annual breast exams: 09/30/2019: Benign --------------------------------------------------------------------------------------------------------- Current treatment: Tamoxifen 20 mg daily Tamoxifen toxicities: Patient is tolerating it extremely well. Weight issues: Patient is unable to lose weight in spite of doing normal sonography efforts and cutting down her food and calorie intake as well as exercising regularly.  She had lost only 5 pounds.  I discussed with her about cutting down the dosage of tamoxifen in half and watching it for the next 3 months. I will touch base with her with a telephone visit in 3 months and follow-up. She definitely needs a left breast MRI annually.  It was supposed to be done today but has not been done. She is very  busy with her new job and has lots of appointments and meetings.    I discussed the assessment and treatment plan with the patient. The patient was provided an opportunity to ask questions and all were answered. The patient agreed with the plan and demonstrated an understanding of the instructions. The patient was advised to call back or seek an in-person evaluation if the symptoms worsen or if the condition fails to  improve as anticipated.   I provided 15 minutes of face-to-face MyChart video visit time during this encounter.    Rulon Eisenmenger, MD 09/30/2019   I, Molly Dorshimer, am acting as scribe for Nicholas Lose, MD.  I have reviewed the above documentation for accuracy and completeness, and I agree with the above.

## 2019-09-30 ENCOUNTER — Ambulatory Visit
Admission: RE | Admit: 2019-09-30 | Discharge: 2019-09-30 | Disposition: A | Discharge status: Home / Self Care | Payer: No Typology Code available for payment source | Source: Ambulatory Visit | Attending: Hematology and Oncology | Admitting: Hematology and Oncology

## 2019-09-30 ENCOUNTER — Other Ambulatory Visit: Payer: Self-pay

## 2019-09-30 ENCOUNTER — Inpatient Hospital Stay
Payer: No Typology Code available for payment source | Attending: Hematology and Oncology | Admitting: Hematology and Oncology

## 2019-09-30 DIAGNOSIS — Z9189 Other specified personal risk factors, not elsewhere classified: Secondary | ICD-10-CM

## 2019-09-30 MED ORDER — GADOBUTROL 1 MMOL/ML IV SOLN
10.0000 mL | Freq: Once | INTRAVENOUS | Status: AC | PRN
  Administered 2019-09-30: 10 mL via INTRAVENOUS

## 2019-09-30 NOTE — Assessment & Plan Note (Signed)
Tyrer[place]us[place]lifetime risk of developing breast cancer: 38.5% Family history: Patient's mother had breast cancer age 52. 91 brothers and 7 sisters. 1 brother had head and neck cancer, 1 sister with ovarian cancer. Her niece had breast cancer at age 52., Grandfather had prostate cancer. Genetic testing: Negative for mutations.12/26/2018  Recommendation: 1.Annual mammograms 02/15/2019: Benign 2.Breast MRIs 03/29/2019: 6 mm right breast mass: Biopsy 04/12/2019: Fibrocystic change with apocrine metaplasia 3.Annual breast exams: 09/30/2019: Benign --------------------------------------------------------------------------------------------------------- Current treatment: Tamoxifen 20 mg daily Tamoxifen toxicities: Patient is tolerating it extremely well.  Breast MRI has been scheduled for today.

## 2019-09-30 NOTE — Telephone Encounter (Signed)
Please review

## 2019-10-01 ENCOUNTER — Telehealth: Payer: Self-pay | Admitting: Hematology and Oncology

## 2019-10-01 NOTE — Telephone Encounter (Signed)
Scheduled per 7/26 los. Called pt and left a msg, mailing appt letter and calendar printout

## 2019-12-30 NOTE — Progress Notes (Signed)
  HEMATOLOGY-ONCOLOGY TELEPHONE VISIT PROGRESS NOTE  I connected with Nancy Wyatt on 12/31/2019 at  2:15 PM EDT by telephone and verified that I am speaking with the correct person using two identifiers.  I discussed the limitations, risks, security and privacy concerns of performing an evaluation and management service by telephone and the availability of in person appointments.  I also discussed with the patient that there may be a patient responsible charge related to this service. The patient expressed understanding and agreed to proceed.   History of Present Illness: Nancy Wyatt is a 54 y.o. female with above-mentioned history of high risk for breast cancer currently on tamoxifen 10mg  daily. She presents over the phone today for follow-up.   Assessment Plan:  At high risk for breast cancer [place]lifetime risk of developing breast cancer: 38.5% Family history: Patient's mother had breast cancer age 38. 34 brothers and 7 sisters. 1 brother had head and neck cancer, 1 sister with ovarian cancer. Her niece had breast cancer at age 9., Grandfather had prostate cancer. Genetic testing: Negative for mutations.12/26/2018  Recommendation: 1.Annual mammograms12/01/2019: Benign 2.Breast MRIs7/28/2021: Benign  3.Annual breast exams: 09/30/2019: Benign --------------------------------------------------------------------------------------------------------- Current treatment: Tamoxifen 20 mg daily, reduce to 10 mg daily on 09/30/2019 because of inability to lose weight Tamoxifen toxicities: Patient is tolerating it extremely well. Weight issues: continues   She has lost a prescription of the past several weeks and has not taken it.  I sent a new prescription to your pharmacy to be refilled at the 10 mg dosage.  Return to clinic in 1 year for follow-up   I discussed the assessment and treatment plan with the patient. The patient was provided an opportunity to ask questions  and all were answered. The patient agreed with the plan and demonstrated an understanding of the instructions. The patient was advised to call back or seek an in-person evaluation if the symptoms worsen or if the condition fails to improve as anticipated.   I provided 12 minutes of non-face-to-face time during this encounter.   Rulon Eisenmenger, MD 12/31/2019    I, Molly Dorshimer, am acting as scribe for Nicholas Lose, MD.  I have reviewed the above documentation for accuracy and completeness, and I agree with the above.

## 2019-12-31 ENCOUNTER — Encounter: Payer: Self-pay | Admitting: Hematology and Oncology

## 2019-12-31 ENCOUNTER — Inpatient Hospital Stay
Payer: No Typology Code available for payment source | Attending: Hematology and Oncology | Admitting: Hematology and Oncology

## 2019-12-31 DIAGNOSIS — Z803 Family history of malignant neoplasm of breast: Secondary | ICD-10-CM | POA: Insufficient documentation

## 2019-12-31 DIAGNOSIS — Z9189 Other specified personal risk factors, not elsewhere classified: Secondary | ICD-10-CM | POA: Diagnosis not present

## 2019-12-31 MED ORDER — TAMOXIFEN CITRATE 10 MG PO TABS: 10.0000 mg | ORAL_TABLET | Freq: Every day | ORAL | 3 refills | Status: AC

## 2019-12-31 NOTE — Assessment & Plan Note (Addendum)
Tyrer[place]us[place]lifetime risk of developing breast cancer: 38.5% Family history: Patient's mother had breast cancer age 52. 15 brothers and 7 sisters. 1 brother had head and neck cancer, 1 sister with ovarian cancer. Her niece had breast cancer at age 76., Grandfather had prostate cancer. Genetic testing: Negative for mutations.12/26/2018  Recommendation: 1.Annual mammograms12/01/2019: Benign 2.Breast MRIs7/28/2021: Benign  3.Annual breast exams: 09/30/2019: Benign --------------------------------------------------------------------------------------------------------- Current treatment: Tamoxifen 20 mg daily, reduce to 10 mg daily on 09/30/2019 because of inability to lose weight Tamoxifen toxicities: Patient is tolerating it extremely well. Weight issues:  Because she was unable to lose weight we reduce the dosage of tamoxifen to 10 mg daily to see if it makes a difference.  Return to clinic in 1 year for follow-up

## 2020-01-03 ENCOUNTER — Telehealth: Payer: Self-pay | Admitting: Hematology and Oncology

## 2020-01-03 NOTE — Telephone Encounter (Signed)
Scheduled per 10/26 los. Called pt and left a msg  

## 2020-03-30 ENCOUNTER — Other Ambulatory Visit: Payer: Self-pay | Admitting: Hematology and Oncology

## 2020-03-30 DIAGNOSIS — Z1231 Encounter for screening mammogram for malignant neoplasm of breast: Secondary | ICD-10-CM

## 2020-05-13 ENCOUNTER — Other Ambulatory Visit: Payer: Self-pay

## 2020-05-13 ENCOUNTER — Ambulatory Visit
Admission: RE | Admit: 2020-05-13 | Discharge: 2020-05-13 | Disposition: A | Discharge status: Home / Self Care | Payer: No Typology Code available for payment source | Source: Ambulatory Visit | Attending: Hematology and Oncology | Admitting: Hematology and Oncology

## 2020-05-13 ENCOUNTER — Ambulatory Visit: Payer: No Typology Code available for payment source

## 2020-05-13 DIAGNOSIS — Z1231 Encounter for screening mammogram for malignant neoplasm of breast: Secondary | ICD-10-CM

## 2020-09-03 ENCOUNTER — Encounter: Payer: Self-pay | Admitting: Hematology and Oncology

## 2020-12-14 ENCOUNTER — Telehealth: Payer: Self-pay | Admitting: *Deleted

## 2020-12-14 NOTE — Telephone Encounter (Signed)
RN attempt x1 to contact pt regarding 01/07/21 appt and MD being out of office.  LVM with detailed information regarding rescheduled appt date and time.

## 2020-12-30 ENCOUNTER — Ambulatory Visit: Payer: No Typology Code available for payment source | Admitting: Hematology and Oncology

## 2021-01-07 ENCOUNTER — Ambulatory Visit: Payer: No Typology Code available for payment source | Admitting: Hematology and Oncology

## 2021-01-12 NOTE — Progress Notes (Signed)
MyChart virtual visit  Patient Care Team: Patient, No Pcp Per (Inactive) as PCP - General (General Practice) Elsie Saas, MD as Consulting Physician (Orthopedic Surgery)  DIAGNOSIS:    ICD-10-CM   1. At high risk for breast cancer  Z91.89       CHIEF COMPLIANT: Follow-up of high risk for breast cancer    INTERVAL HISTORY: Nancy Wyatt is a 55 y.o. with above-mentioned history of high risk for breast cancer  Mammogram on 05/13/2020 showed no evidence of malignancy. She presents to the clinic today for follow-up.  She took tamoxifen for about a year and developed uterine bleeding and therefore tamoxifen was discontinued.  She had a gynecologist perform a D&C which showed some suspicious changes and had to be seen by additional specialist.  The endometrial thickening contributed to the heavy bleeding.  The plan for her is to undergo another D&C procedure and eventually hysterectomy.  She has seen a high risk breast clinic in [place] who recommended holding off on tamoxifen and to consider waiting until she undergoes hysterectomy to restart the tamoxifen.  She wanted to know if this is the right approach and therefore she made an appointment to connect with me through a MyChart virtual visit.  ALLERGIES:  is allergic to sulfamethoxazole.  MEDICATIONS:  Current Outpatient Medications  Medication Sig Dispense Refill   albuterol (PROAIR HFA) 108 (90 Base) MCG/ACT inhaler 2 puff ever 8 hours as needed. 2 Inhaler 3   hydrochlorothiazide (HYDRODIURIL) 12.5 MG tablet HYDROCHLOROTHIAZIDE 12.5 MG TABLET 90 tablet 1   ketoconazole (NIZORAL) 2 % cream ketoconazole 2 % topical cream 15 g 1   ketoconazole (NIZORAL) 2 % shampoo USE AS DIRECTED 120 mL 1   phentermine (ADIPEX-P) 37.5 MG tablet Take 1 tablet (37.5 mg total) by mouth daily before breakfast. 30 tablet 2   tamoxifen (NOLVADEX) 10 MG tablet Take 1 tablet (10 mg total) by mouth daily. 90 tablet 3   traZODone (DESYREL) 50 MG tablet TAKE 1/2  TO 1 TABLET BY MOUTH EVERY DAY AT BEDTIME AS NEEDED FOR SLEEP. 90 tablet 2   No current facility-administered medications for this visit.    PHYSICAL EXAMINATION: ECOG PERFORMANCE STATUS: 1 - Symptomatic but completely ambulatory    LABORATORY DATA:  I have reviewed the data as listed CMP Latest Ref Rng & Units 05/21/2018  Glucose 70 - 99 mg/dL 88  BUN 6 - 23 mg/dL 10  Creatinine 0.40 - 1.20 mg/dL 0.94  Sodium 135 - 145 mEq/L 141  Potassium 3.5 - 5.1 mEq/L 4.7  Chloride 96 - 112 mEq/L 105  CO2 19 - 32 mEq/L 29  Calcium 8.4 - 10.5 mg/dL 9.2  Total Protein 6.0 - 8.3 g/dL 6.7  Total Bilirubin 0.2 - 1.2 mg/dL 0.4  Alkaline Phos 39 - 117 U/L 68  AST 0 - 37 U/L 15  ALT 0 - 35 U/L 18    Lab Results  Component Value Date   WBC 5.6 05/21/2018   HGB 14.2 05/21/2018   HCT 42.6 05/21/2018   MCV 91.3 05/21/2018   PLT 227.0 05/21/2018   NEUTROABS 3.2 05/21/2018    ASSESSMENT & PLAN:  At high risk for breast cancer [place] lifetime risk of developing breast cancer: 38.5% Family history: Patient's mother had breast cancer age 56.  3 brothers and 7 sisters.  1 brother had head and neck cancer, 1 sister with ovarian cancer.  Her niece had breast cancer at age 73.,  Grandfather had prostate cancer. Genetic testing:  Negative for mutations. 12/26/2018   Recommendation: 1.  Annual mammograms 05/15/2020 benign 2.  Breast MRIs 10/02/2019: Benign  3.  Annual breast exams: 01/13/2021: Benign --------------------------------------------------------------------------------------------------------- Current treatment: Tamoxifen 20 mg daily, reduced to 10 mg daily on 09/30/2019 stopped June 2022 Tamoxifen toxicities: Vaginal bleeding: Thickened endometrium: stopped Tam in June 2022, D/C < 1 mm CIN (not sure if they got it all). Saw Gynecology and she saw another Gyn for second opinion. Took megestrol X 10 days U/S: endometrium thickened but less. Again bleeding and went back on Megestrol. Another  hysteroscopy and D/C planned. IUD is being planned  Saw a high risk breast clinic in [place]: recommended not taking Tam at this time.  Once she has hysterectomy, she can resume Tam  Weight issues: continues  Moved to [place]  If she has any questions we can set up a virtual visit to discuss in the future.   No orders of the defined types were placed in this encounter.  The patient has a good understanding of the overall plan. she agrees with it. she will call with any problems that may develop before the next visit here.  Total time spent: 20 mins including face to face time and time spent for planning, charting and coordination of care  Rulon Eisenmenger, MD, MPH 01/13/2021  I, Thana Ates, am acting as scribe for Dr. Nicholas Lose.  I have reviewed the above documentation for accuracy and completeness, and I agree with the above.

## 2021-01-13 ENCOUNTER — Inpatient Hospital Stay
Payer: No Typology Code available for payment source | Attending: Hematology and Oncology | Admitting: Hematology and Oncology

## 2021-01-13 DIAGNOSIS — R9389 Abnormal findings on diagnostic imaging of other specified body structures: Secondary | ICD-10-CM | POA: Diagnosis not present

## 2021-01-13 DIAGNOSIS — Z9189 Other specified personal risk factors, not elsewhere classified: Secondary | ICD-10-CM | POA: Insufficient documentation

## 2021-01-13 DIAGNOSIS — N939 Abnormal uterine and vaginal bleeding, unspecified: Secondary | ICD-10-CM | POA: Diagnosis not present

## 2021-01-13 DIAGNOSIS — Z803 Family history of malignant neoplasm of breast: Secondary | ICD-10-CM | POA: Insufficient documentation

## 2021-01-13 DIAGNOSIS — Z8041 Family history of malignant neoplasm of ovary: Secondary | ICD-10-CM | POA: Insufficient documentation

## 2021-01-13 NOTE — Assessment & Plan Note (Signed)
Tyrer[place]us[place]lifetime risk of developing breast cancer: 38.5% Family history: Patient's mother had breast cancer age 53. 23 brothers and 7 sisters. 1 brother had head and neck cancer, 1 sister with ovarian cancer. Her niece had breast cancer at age 22., Grandfather had prostate cancer. Genetic testing: Negative for mutations.12/26/2018  Recommendation: 1.Annual mammograms3/01/2021 benign 2.Breast MRIs7/28/2021: Benign  3.Annual breast exams: 01/13/2021: Benign --------------------------------------------------------------------------------------------------------- Current treatment: Tamoxifen 20 mg daily, reduce to 10 mg daily on 09/30/2019 because of inability to lose weight Tamoxifen toxicities: Patient is tolerating it extremely well. Weight issues: continues   Return to clinic in 1 year for follow-up

## 2021-03-10 IMAGING — MR MR BREAST BX W/ LOC DEV 1ST LEASION IMAGE BX SPEC MR GUIDE*R*
7 of 10 series · 33 of 48 positions shown · IV contrast (10 ml Gadavist)
Comparison: Previous exams.
COMPARISON: Previous exams.

Addendum:
CLINICAL DATA: 53-year-old female for tissue sampling of 0.6 cm
LOWER central RIGHT breast mass.

EXAM:
MRI GUIDED CORE NEEDLE BIOPSY OF THE RIGHT BREAST
TECHNIQUE: Multiplanar, multisequence MR imaging of the RIGHT breast was
performed both before and after administration of intravenous
contrast.
CONTRAST:  10mL GADAVIST GADOBUTROL 1 MMOL/ML IV SOLN

[Series 3: dynamic pre · axial · non-contrast · 1.3mm · 0.73mm/px · z∈[-68,+139]mm · 6 of 160 slices shown]
[im 1/160]
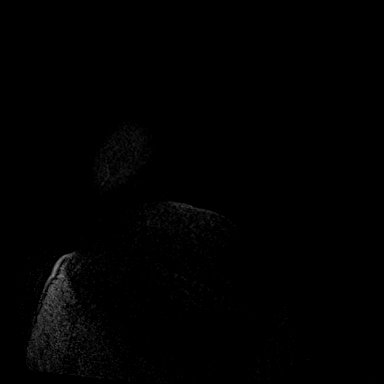
[im 32/160]
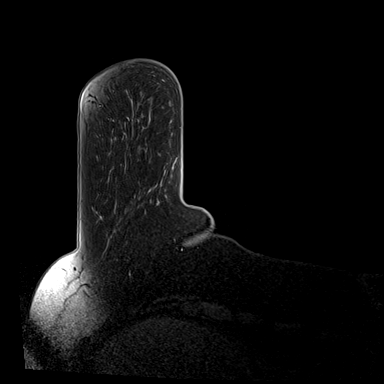
[im 64/160]
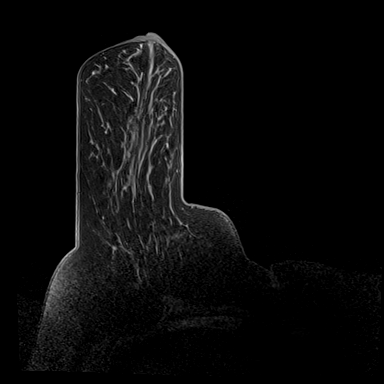
[im 96/160]
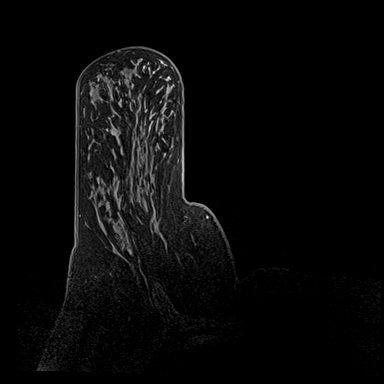
[im 128/160]
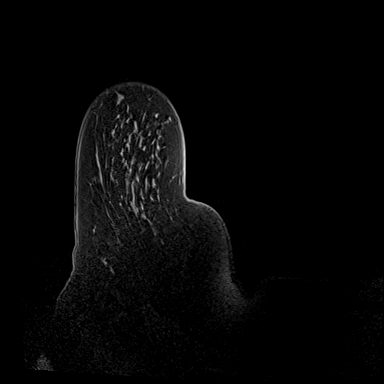
[im 160/160]
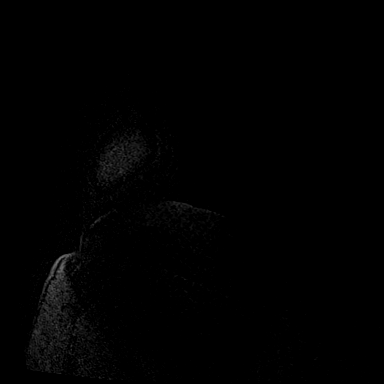

[Series 4: fiducial unilateral · sagittal · 2.0mm · 1.33mm/px · 2 of 72 slices shown]
[im 1/72]
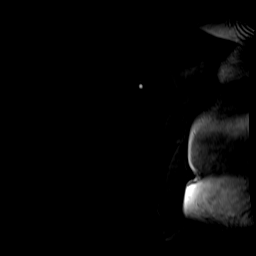
[im 72/72]
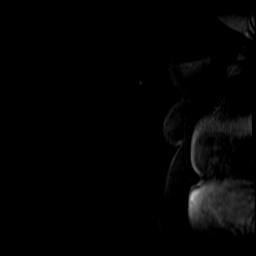

[Series 5: dynamic post 20 · axial · 1.3mm · 0.73mm/px · z∈[-68,+139]mm · 5 of 160 slices shown (1 of 2)]
[im 1/160]
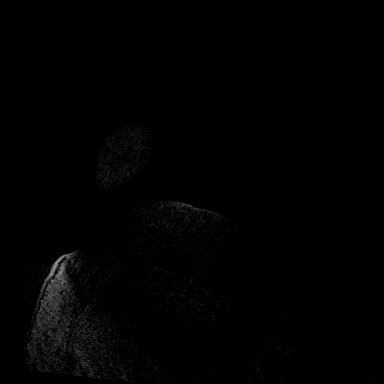
[im 40/160]
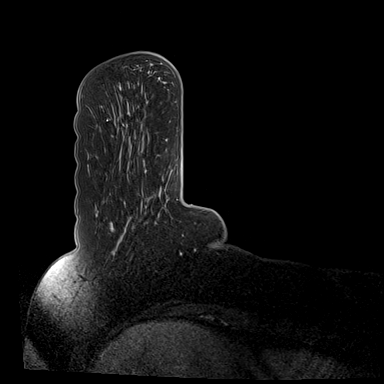
[im 80/160]
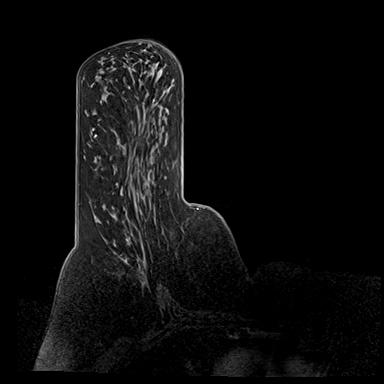
[im 120/160]
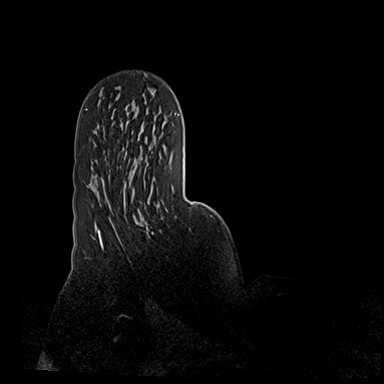
[im 160/160]
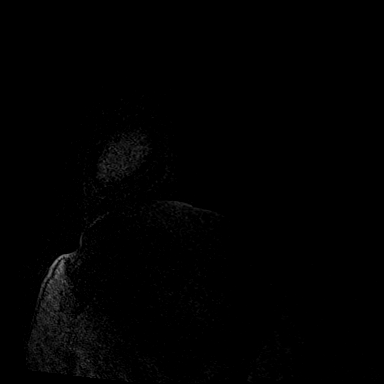

[Series 6: dynamic post 20 · axial · 1.3mm · 0.73mm/px · z∈[-68,+139]mm · 5 of 160 slices shown (2 of 2)]
[im 1/160]
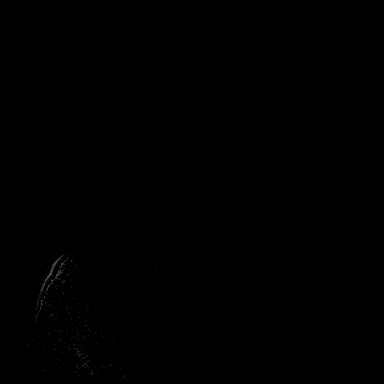
[im 40/160]
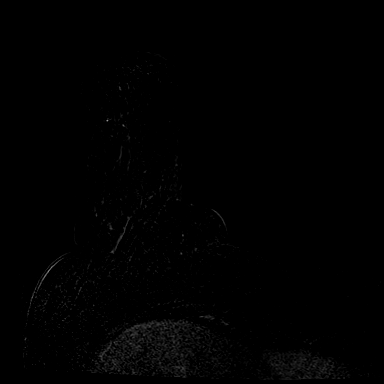
[im 80/160]
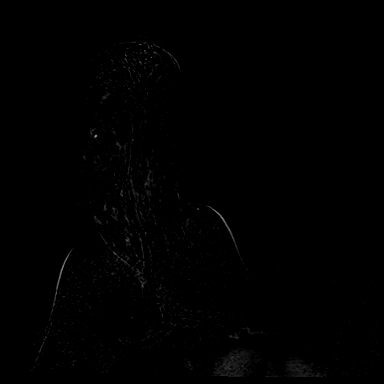
[im 120/160]
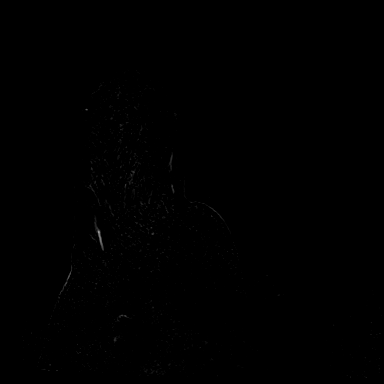
[im 160/160]
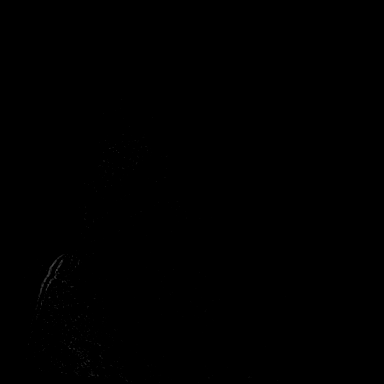

[Series 7: dynamic post 3 · axial · 1.3mm · 0.73mm/px · z∈[-68,+139]mm · 5 of 160 slices shown (1 of 2)]
[im 1/160]
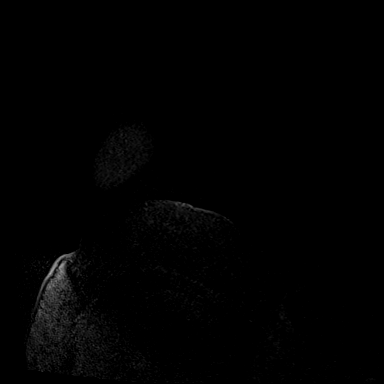
[im 40/160]
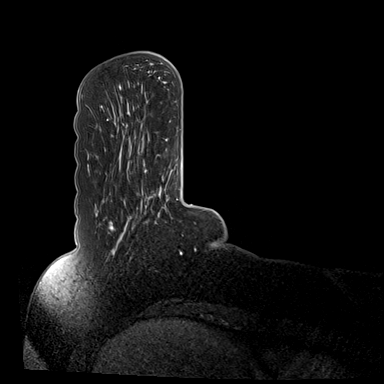
[im 80/160]
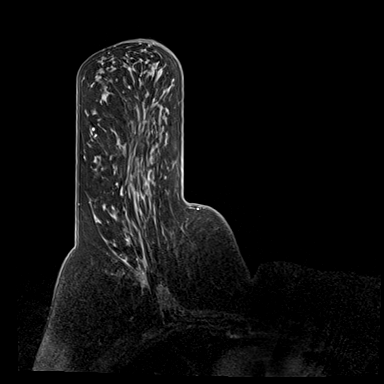
[im 120/160]
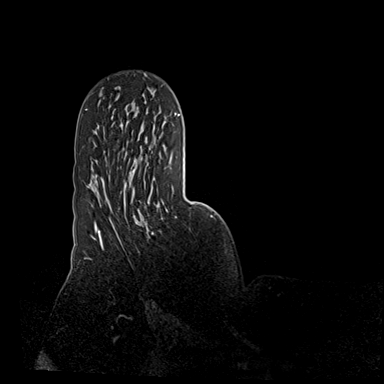
[im 160/160]
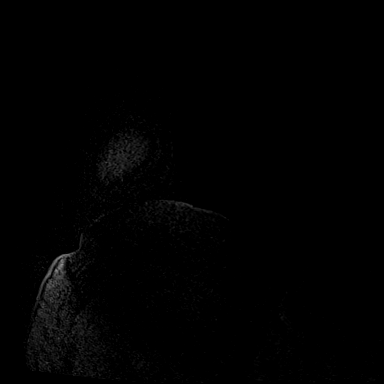

[Series 8: dynamic post 3 · axial · 1.3mm · 0.73mm/px · z∈[-68,+139]mm · 5 of 160 slices shown (2 of 2)]
[im 1/160]
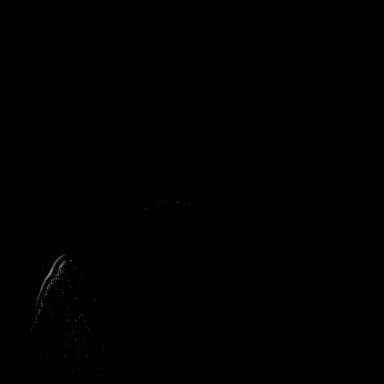
[im 40/160]
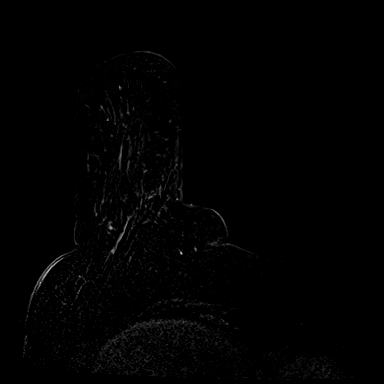
[im 80/160]
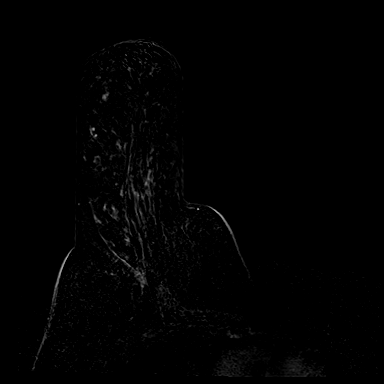
[im 120/160]
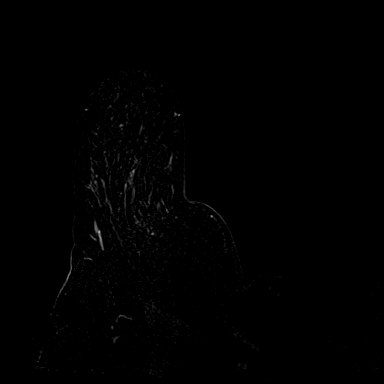
[im 160/160]
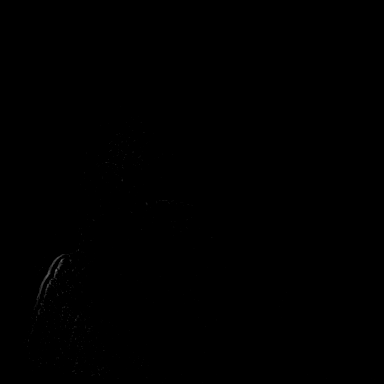

[Series 9: needle confirmation · axial · 1.3mm · 0.73mm/px · z∈[-68,+139]mm · 5 of 160 slices shown]
[im 1/160]
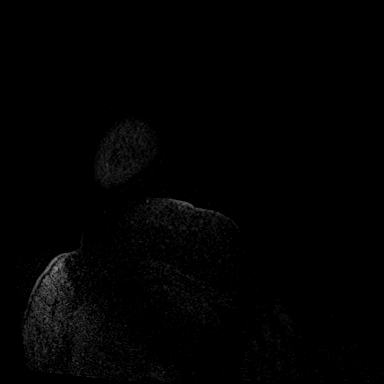
[im 40/160]
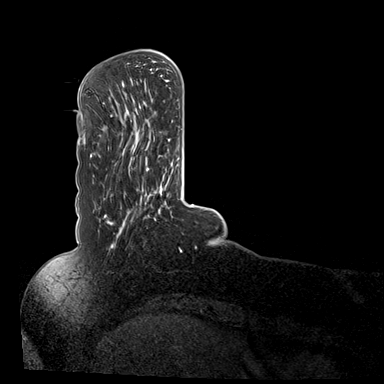
[im 80/160]
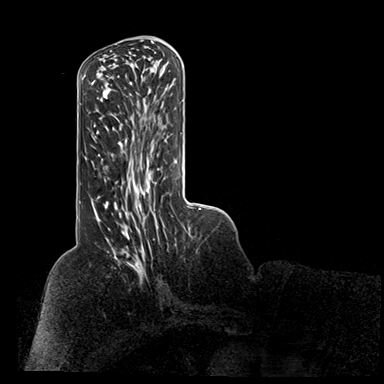
[im 120/160]
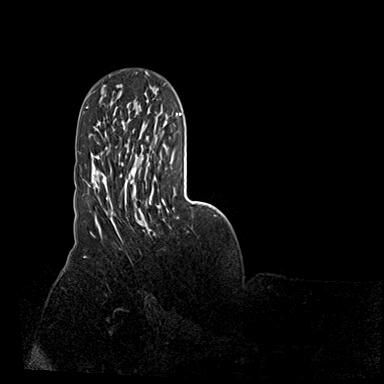
[im 160/160]
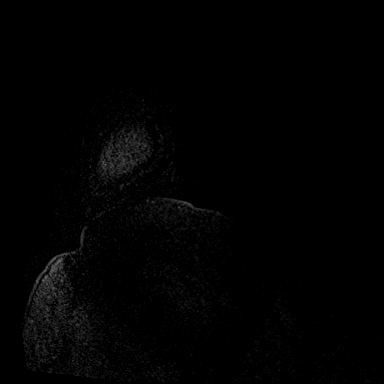

[33 of 48 positions shown; findings below may reference images not displayed]

FINDINGS: I met with the patient, and we discussed the procedure of MRI guided
biopsy, including risks, benefits, and alternatives. Specifically,
we discussed the risks of infection, bleeding, tissue injury, clip
migration, and inadequate sampling. Informed, written consent was
given. The usual time out protocol was performed immediately prior
to the procedure.

Using sterile technique, 1% Lidocaine, MRI guidance, and a 9 gauge
vacuum assisted device, biopsy was performed of the 0.6 cm mass
within the LOWER central RIGHT breast using a LATERAL approach. At
the conclusion of the procedure, a BARBELL tissue marker clip was
deployed into the biopsy cavity. Follow-up 2-view mammogram was
performed and dictated separately.
IMPRESSION: MRI guided biopsy of 0.6 cm LOWER central RIGHT breast mass. No
apparent complications.

ADDENDUM:
Pathology revealed FIBROCYSTIC CHANGES WITH APOCRINE METAPLASIA of
the Right breast, lower central. This was found to be concordant by
Dr. Jhemboy Padam.

Pathology results were discussed with the patient by telephone. The
patient reported doing well after the biopsy with tenderness at the
site. Post biopsy instructions and care were reviewed and questions
were answered. The patient was encouraged to call The [REDACTED]

The patient was instructed to return for a bilateral breast MRI in 6
months, per protocol.

Pathology results reported by Unrulybrasoclay Lormand, RN on 04/16/2019.

*** End of Addendum ***
FINDINGS: I met with the patient, and we discussed the procedure of MRI guided
biopsy, including risks, benefits, and alternatives. Specifically,
we discussed the risks of infection, bleeding, tissue injury, clip
migration, and inadequate sampling. Informed, written consent was
given. The usual time out protocol was performed immediately prior
to the procedure.

Using sterile technique, 1% Lidocaine, MRI guidance, and a 9 gauge
vacuum assisted device, biopsy was performed of the 0.6 cm mass
within the LOWER central RIGHT breast using a LATERAL approach. At
the conclusion of the procedure, a BARBELL tissue marker clip was
deployed into the biopsy cavity. Follow-up 2-view mammogram was
performed and dictated separately.
IMPRESSION: MRI guided biopsy of 0.6 cm LOWER central RIGHT breast mass. No
apparent complications.

## 2022-01-20 ENCOUNTER — Encounter: Payer: Self-pay | Admitting: Gastroenterology
